# Patient Record
Sex: Female | Born: 2000 | Race: White | Hispanic: No | Marital: Single | Smoking: Never smoker
Health system: Southern US, Community
[De-identification: ages and names within clinical notes are randomized; demographics above are authoritative.]

---
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
## 2021-04-23 ENCOUNTER — Ambulatory Visit (INDEPENDENT_AMBULATORY_CARE_PROVIDER_SITE_OTHER): Payer: Medicaid Other | Admitting: Sports Medicine

## 2021-04-23 ENCOUNTER — Other Ambulatory Visit: Payer: Self-pay

## 2021-04-23 ENCOUNTER — Encounter: Payer: Self-pay | Admitting: Sports Medicine

## 2021-04-23 DIAGNOSIS — M2041 Other hammer toe(s) (acquired), right foot: Secondary | ICD-10-CM | POA: Diagnosis not present

## 2021-04-23 DIAGNOSIS — M792 Neuralgia and neuritis, unspecified: Secondary | ICD-10-CM

## 2021-04-23 DIAGNOSIS — M79675 Pain in left toe(s): Secondary | ICD-10-CM | POA: Diagnosis not present

## 2021-04-23 DIAGNOSIS — M2042 Other hammer toe(s) (acquired), left foot: Secondary | ICD-10-CM

## 2021-04-23 DIAGNOSIS — B351 Tinea unguium: Secondary | ICD-10-CM

## 2021-04-23 NOTE — Addendum Note (Signed)
Addended by: Melene Plan on: 04/23/2021 03:40 PM   Modules accepted: Orders

## 2021-04-23 NOTE — Progress Notes (Signed)
Subjective: Sherry Kim is a 20 y.o. female patient seen today in office with complaint of mildly painful thickened and discolored nails on the left 2,4,5 toes x 6 months. Patient is desiring treatment for nail changes; has tried OTC topicals/Medication in the past with no improvement and went to another podiatrist that did nothing for her nails. Reports that she has has numbness of the left 4th toe. No injury. Patient has no other pedal complaints at this time.   Patient Active Problem List   Diagnosis Date Noted   Full thickness sunburn 12/22/2020   Intrinsic eczema 12/22/2020   Posttraumatic headache 11/07/2020   Intractable migraine without aura and without status migrainosus 04/18/2019   Alternating constipation and diarrhea 04/18/2019   Irregular menstrual bleeding 04/18/2019   Tail bone pain 04/18/2019   Viral syndrome 08/22/2018   Acute viral laryngitis 04/07/2018   Meningitis in pediatric patient 10/21/2017   Dysmenorrhea in the adolescent 10/21/2017   Lactose intolerance 10/21/2017   Elevated ALT measurement 08/30/2017   Chronic idiopathic constipation 08/29/2017   Episode of dizziness 08/29/2017   BRBPR (bright red blood per rectum) 08/29/2017    No current outpatient medications on file prior to visit.   No current facility-administered medications on file prior to visit.    No Known Allergies  Objective: Physical Exam  General: Well developed, nourished, no acute distress, awake, alert and oriented x 3  Vascular: Dorsalis pedis artery 2/4 bilateral, Posterior tibial artery 2/4 bilateral, skin temperature warm to warm proximal to distal bilateral lower extremities, no varicosities, pedal hair present bilateral.  Neurological: Gross sensation present via light touch bilateral. Decreased sensation at tip of   Dermatological: Skin is warm, dry, and supple bilateral, Nails 2,4,5 on left are tender, short thick, and discolored with mild subungal debris, no  webspace macerations present bilateral, no open lesions present bilateral, no callus/corns/hyperkeratotic tissue present bilateral. No signs of infection bilateral.  Musculoskeletal: + varus 4th hammertoe L>R boney deformities noted bilateral. Muscular strength within normal limits without painon range of motion. No pain with calf compression bilateral.  Assessment and Plan:  Problem List Items Addressed This Visit   None Visit Diagnoses     Fungal infection of nail    -  Primary   Toe pain, left       Hammertoe, bilateral       Neuritis           -Examined patient -Discussed treatment options for numbness at left 4th toe -Recommend toe cap to protect toe since I feel that her hammertoe rubbing could be adding to her symptoms -Discussed treatment options for painful dystrophic nails  -Fungal culture was obtained by removing a portion of the hard nail itself from each of the involved toenails using a sterile nail nipper and sent to [facility] lab. Patient tolerated the biopsy procedure well without discomfort or need for anesthesia.  -Patient to return in 4 weeks for follow up evaluation and discussion of fungal culture results or sooner if symptoms worsen.  Asencion Islam, DPM

## 2021-05-21 ENCOUNTER — Other Ambulatory Visit: Payer: Self-pay

## 2021-05-21 ENCOUNTER — Ambulatory Visit (INDEPENDENT_AMBULATORY_CARE_PROVIDER_SITE_OTHER): Payer: Medicaid Other | Admitting: Sports Medicine

## 2021-05-21 DIAGNOSIS — M792 Neuralgia and neuritis, unspecified: Secondary | ICD-10-CM

## 2021-05-21 DIAGNOSIS — M2042 Other hammer toe(s) (acquired), left foot: Secondary | ICD-10-CM | POA: Diagnosis not present

## 2021-05-21 DIAGNOSIS — M79675 Pain in left toe(s): Secondary | ICD-10-CM

## 2021-05-21 DIAGNOSIS — M2041 Other hammer toe(s) (acquired), right foot: Secondary | ICD-10-CM

## 2021-05-21 DIAGNOSIS — B351 Tinea unguium: Secondary | ICD-10-CM | POA: Diagnosis not present

## 2021-05-21 NOTE — Progress Notes (Signed)
Subjective: Sherry Kim is a 20 y.o. female patient seen today in office for fungal culture results.  Patient admits that she still has some numbness to the left fourth toe but otherwise no other pedal complaints.  Patient also admits that her doctor is working her up for an autoimmune condition.  Patient Active Problem List   Diagnosis Date Noted   Full thickness sunburn 12/22/2020   Intrinsic eczema 12/22/2020   Posttraumatic headache 11/07/2020   Intractable migraine without aura and without status migrainosus 04/18/2019   Alternating constipation and diarrhea 04/18/2019   Irregular menstrual bleeding 04/18/2019   Tail bone pain 04/18/2019   Viral syndrome 08/22/2018   Acute viral laryngitis 04/07/2018   Meningitis in pediatric patient 10/21/2017   Dysmenorrhea in the adolescent 10/21/2017   Lactose intolerance 10/21/2017   Elevated ALT measurement 08/30/2017   Chronic idiopathic constipation 08/29/2017   Episode of dizziness 08/29/2017   BRBPR (bright red blood per rectum) 08/29/2017    No current outpatient medications on file prior to visit.   No current facility-administered medications on file prior to visit.    No Known Allergies  Objective: Physical Exam  General: Well developed, nourished, no acute distress, awake, alert and oriented x 3  Vascular: Dorsalis pedis artery 2/4 bilateral, Posterior tibial artery 2/4 bilateral, skin temperature warm to warm proximal to distal bilateral lower extremities, no varicosities, pedal hair present bilateral.   Neurological: Gross sensation present via light touch bilateral. Decreased sensation at tip of    Dermatological: Skin is warm, dry, and supple bilateral, Nails 2,4,5 on left are tender, short thick, and discolored with mild subungal debris, no webspace macerations present bilateral, no open lesions present bilateral, no callus/corns/hyperkeratotic tissue present bilateral. No signs of infection bilateral.    Musculoskeletal: + varus 4th hammertoe L>R boney deformities noted bilateral. Muscular strength within normal limits without painon range of motion. 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  Asencion Islam, DPM

## 2021-05-29 ENCOUNTER — Ambulatory Visit (INDEPENDENT_AMBULATORY_CARE_PROVIDER_SITE_OTHER): Payer: Self-pay | Admitting: *Deleted

## 2021-05-29 ENCOUNTER — Other Ambulatory Visit: Payer: Self-pay

## 2021-05-29 DIAGNOSIS — B351 Tinea unguium: Secondary | ICD-10-CM

## 2021-05-29 NOTE — Progress Notes (Signed)
Patient presents today for the 1st laser treatment. Diagnosed with mycotic nail infection by Dr. Marylene Land.   Toenail most affected 2nd, 4th, 5th toenails left.  All other systems are negative.  Nails were filed thin. Laser therapy was administered to 2, 4, 5 toenails left and patient tolerated the treatment well. All safety precautions were in place.    Follow up in 4 weeks for laser # 2.  Picture of nails taken today to document visual progress

## 2021-07-03 ENCOUNTER — Other Ambulatory Visit: Payer: Self-pay

## 2021-07-03 ENCOUNTER — Ambulatory Visit (INDEPENDENT_AMBULATORY_CARE_PROVIDER_SITE_OTHER): Payer: Medicaid Other | Admitting: *Deleted

## 2021-07-03 DIAGNOSIS — B351 Tinea unguium: Secondary | ICD-10-CM

## 2021-07-03 NOTE — Progress Notes (Signed)
Patient presents today for the 2nd laser treatment. Diagnosed with mycotic nail infection by Dr. Marylene Land.   Toenail most affected 2nd, 4th, 5th toenails left. Looking some better.  All other systems are negative.  Nails were filed thin. Laser therapy was administered to 2, 4, 5 toenails left and patient tolerated the treatment well. All safety precautions were in place.    Follow up in 4 weeks for laser # 3.

## 2021-07-20 NOTE — Progress Notes (Signed)
Office Visit Note  Patient: Sherry Kim             Date of Birth: 02-Feb-2001           MRN: [id number]             PCP: Idelia Salm, MD Referring: Wilhelmina Mcardle, MD Visit Date: 07/21/2021 Occupation: Pre-med student  Subjective:  New Patient (Initial Visit) (Hit head 10/2020, migraines, total body pain, fatigue, abnormal labs, Positive ANA, labs in [place])   History of Present Illness: Sherry Kim is a 21 y.o. female here for positive ANA.  She is having multiple new symptoms since early last year particularly after new headaches from striking her head on a roller coaster in April.  Is unclear whether she experienced a true concussion or just had some exacerbation of underlying headaches probably migrainous.  Before this she was generally well medical history including eczema, migraine, and possible irritable bowels.  Headaches have been the biggest problem but also experiencing worsening of fatigue and increased overall joint pains.  Intermittently symptoms including facial rashes that come and go for days at a time, pain and mild swelling under the armpits currently on the right side, spontaneous bruising on her thighs, and purple discoloration of toes. She has been hesitant to start prescription migraine medications still concerned about underlying causes with a normal brain MRI in November.  She has associated photophobia.  Lab work-up for this in neurology clinic demonstrated the highly positive ANA antibodies.  She most often uses ibuprofen and sleeps with resolution of headache. The facial rashes come and go no association with sun exposure.  She does not experience brief flushing here or in other locations with heat or humidity.  She had eczema rashes more in the extremities but had improved before now.  She denies any noticeable alopecia.  No oral or nasal ulcers except when biting her cheek.  Painful swelling under her armpits without any  palpable nodules.  Purple discoloration in her toes when cold this improves after rewarming.  She does not see any pallor and there is no involvement of her fingers.  Spontaneous bruises have been mostly in the upper thigh without significant pain or itching sensation.  She has no history of abnormal bleeding or blood clots and has never been pregnant.  Labs reviewed 05/2021 ANA 1:2560 homogenous 1:2560 speckled dsDNA 34 SM, RNP, Ro, La neg RF neg ESR 11 TSH 6.27 Free T4 1.02 UA wnl  Activities of Daily Living:  Patient reports morning stiffness for 0  none .   Patient Reports nocturnal pain.  Difficulty dressing/grooming: Denies Difficulty climbing stairs: Denies Difficulty getting out of chair: Denies Difficulty using hands for taps, buttons, cutlery, and/or writing: Reports  Review of Systems  Constitutional:  Positive for fatigue.  HENT:  Positive for mouth dryness.   Eyes:  Positive for dryness.  Respiratory:  Negative for shortness of breath.   Cardiovascular:  Positive for swelling in legs/feet.  Gastrointestinal:  Positive for constipation.  Endocrine: Positive for cold intolerance, heat intolerance and excessive thirst.  Genitourinary:  Negative for difficulty urinating.  Musculoskeletal:  Positive for joint pain, joint pain, muscle weakness and muscle tenderness.  Skin:  Positive for rash.  Allergic/Immunologic: Negative for susceptible to infections.  Neurological:  Positive for dizziness, light-headedness, numbness, headaches, memory loss and weakness.  Hematological:  Positive for bruising/bleeding tendency.  Psychiatric/Behavioral:  Positive for sleep disturbance.    PMFS History:  Patient Active  Problem List   Diagnosis Date Noted   Positive ANA (antinuclear antibody) 07/21/2021   TSH elevation 07/21/2021   Facial rash 07/21/2021   Intrinsic eczema 12/22/2020   Headache 11/07/2020   Intractable migraine without aura and without status migrainosus 04/18/2019    Alternating constipation and diarrhea 04/18/2019   Irregular menstrual bleeding 04/18/2019   Tail bone pain 04/18/2019   Acute viral laryngitis 04/07/2018   Dysmenorrhea in the adolescent 10/21/2017   Lactose intolerance 10/21/2017   Elevated liver enzymes 08/30/2017   Chronic idiopathic constipation 08/29/2017   Episode of dizziness 08/29/2017   BRBPR (bright red blood per rectum) 08/29/2017    History reviewed. No pertinent past medical history.  Family History  Problem Relation Age of Onset   Migraines Mother    Ankylosing spondylitis Father    History reviewed. No pertinent surgical history. Social History   Social History Narrative   Not on file   Immunization History  Administered Date(s) Administered   HPV 9-valent 04/03/2014, 06/03/2014   Meningococcal Conjugate 04/03/2014     Objective: Vital Signs: BP 104/66 (BP Location: Right Arm, Patient Position: Sitting, Cuff Size: Normal)    Pulse 76    Resp 14    Ht _0  (1.575 m)    Wt 167 lb (75.8 kg)    BMI 30.54 kg/m    Physical Exam HENT:     Right Ear: External ear normal.     Left Ear: External ear normal.     Mouth/Throat:     Mouth: Mucous membranes are moist.     Pharynx: Oropharynx is clear.  Eyes:     Conjunctiva/sclera: Conjunctivae normal.  Cardiovascular:     Rate and Rhythm: Normal rate and regular rhythm.  Pulmonary:     Effort: Pulmonary effort is normal.     Breath sounds: Normal breath sounds.  Musculoskeletal:     Right lower leg: No edema.     Left lower leg: No edema.  Skin:    General: Skin is warm and dry.     Findings: No rash.     Comments: Normal nailfold capillaroscopy Violaceous left great toenail, dystrophy or onychomycosis changes at left toenails  Neurological:     General: No focal deficit present.     Mental Status: She is alert.     Deep Tendon Reflexes: Reflexes normal.  Psychiatric:        Mood and Affect: Mood normal.     Musculoskeletal Exam:  Neck full ROM no  tenderness Shoulders full ROM no tenderness or swelling Elbows hyperextensible with no tenderness or swelling Wrists full ROM no tenderness or swelling Fingers hyperextensible at multiple joints, thumb apposition to forearm without pain, mild tenderness present in PIPs no swelling Knees full ROM no tenderness or swelling Ankles full ROM no tenderness or swelling   Investigation: No additional findings.  Imaging: No results found.  Recent Labs: Lab Results  Component Value Date   WBC 6.8 07/21/2021   HGB 13.8 07/21/2021   PLT 292 07/21/2021    Speciality Comments: No specialty comments available.  Procedures:  No procedures performed Allergies: Patient has no known allergies.   Assessment / Plan:     Visit Diagnoses: Positive ANA (antinuclear antibody) - Plan: Anti-DNA antibody, double-stranded, C3 and C4, Beta-2 glycoprotein antibodies, Cardiolipin antibodies, IgG, IgM, IgA, Lupus Anticoagulant Eval w/Reflex, Thyroid Peroxidase Antibodies (TPO) (REFL), CBC with Differential/Platelet  Highly positive antibody titer checking specific antibody tests as above, complements, also antiphospholipid antibodies for possible  extra criteria disease. No proteinuria on fairly recent UA. 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  Intractable migraine without aura and without status migrainosus Nonintractable headache, unspecified chronicity pattern, unspecified headache type  Ongoing headaches the worst symptom currently so far normal brain imaging and treatment with migraine headache plan partially beneficial so far. Working up for positive ANA as above.  TSH elevation  Mildly increased TSH with normal thyroid hormone levels. Does not have obvious symptoms to indicate subclinical hypothyroidism. Will also check TPO Abs as part of ANA workup.  Facial rash  Not much current erythema  unclear whether could be experiencing malar rash. Lack of photosensitivity and increased symptoms in dry, winter weather not typical for systemic inflammatory disease.  Orders: Orders Placed This Encounter  Procedures   Anti-DNA antibody, double-stranded   C3 and C4   Beta-2 glycoprotein antibodies   Cardiolipin antibodies, IgG, IgM, IgA   Lupus Anticoagulant Eval w/Reflex   Thyroid Peroxidase Antibodies (TPO) (REFL)   CBC with Differential/Platelet   rflx hexagonal Phase Confirm   No orders of the defined types were placed in this encounter.   Follow-Up Instructions: Return for New pt +ANA/[facility] f/u.   Collier Salina, MD  Note - This record has been created using [employer].  Chart creation errors have been sought, but may not always  have been located. Such creation errors do not reflect on  the standard of medical care.

## 2021-07-21 ENCOUNTER — Encounter: Payer: Self-pay | Admitting: Internal Medicine

## 2021-07-21 ENCOUNTER — Ambulatory Visit (INDEPENDENT_AMBULATORY_CARE_PROVIDER_SITE_OTHER): Payer: Medicaid Other | Admitting: Internal Medicine

## 2021-07-21 ENCOUNTER — Other Ambulatory Visit: Payer: Self-pay

## 2021-07-21 VITALS — BP 104/66 | HR 76 | Resp 14 | Ht 62.0 in | Wt 167.0 lb

## 2021-07-21 DIAGNOSIS — R7989 Other specified abnormal findings of blood chemistry: Secondary | ICD-10-CM | POA: Insufficient documentation

## 2021-07-21 DIAGNOSIS — G43019 Migraine without aura, intractable, without status migrainosus: Secondary | ICD-10-CM | POA: Diagnosis not present

## 2021-07-21 DIAGNOSIS — R519 Headache, unspecified: Secondary | ICD-10-CM | POA: Diagnosis not present

## 2021-07-21 DIAGNOSIS — R768 Other specified abnormal immunological findings in serum: Secondary | ICD-10-CM

## 2021-07-21 DIAGNOSIS — R21 Rash and other nonspecific skin eruption: Secondary | ICD-10-CM | POA: Insufficient documentation

## 2021-07-25 LAB — CBC WITH DIFFERENTIAL/PLATELET
Absolute Monocytes: 517 cells/uL (ref 200–950)
Basophils Absolute: 27 cells/uL (ref 0–200)
Basophils Relative: 0.4 %
Eosinophils Absolute: 143 cells/uL (ref 15–500)
Eosinophils Relative: 2.1 %
HCT: 43 % (ref 35.0–45.0)
Hemoglobin: 13.8 g/dL (ref 11.7–15.5)
Lymphs Abs: 2611 cells/uL (ref 850–3900)
MCH: 27.9 pg (ref 27.0–33.0)
MCHC: 32.1 g/dL (ref 32.0–36.0)
MCV: 86.9 fL (ref 80.0–100.0)
MPV: 10.7 fL (ref 7.5–12.5)
Monocytes Relative: 7.6 %
Neutro Abs: 3502 cells/uL (ref 1500–7800)
Neutrophils Relative %: 51.5 %
Platelets: 292 10*3/uL (ref 140–400)
RBC: 4.95 10*6/uL (ref 3.80–5.10)
RDW: 13.1 % (ref 11.0–15.0)
Total Lymphocyte: 38.4 %
WBC: 6.8 10*3/uL (ref 3.8–10.8)

## 2021-07-25 LAB — RFLX HEXAGONAL PHASE CONFIRM: Hexagonal Phase Conf: NEGATIVE

## 2021-07-25 LAB — LUPUS ANTICOAGULANT EVAL W/ REFLEX
PTT-LA Screen: 44 s — ABNORMAL HIGH (ref <=40)
dRVVT: 37 s (ref <=45)

## 2021-07-25 LAB — CARDIOLIPIN ANTIBODIES, IGG, IGM, IGA
Anticardiolipin IgA: <2 APL-U/mL
Anticardiolipin IgG: <2 GPL-U/mL
Anticardiolipin IgM: <2 MPL-U/mL

## 2021-07-25 LAB — ANTI-DNA ANTIBODY, DOUBLE-STRANDED: ds DNA Ab: 2 IU/mL

## 2021-07-25 LAB — BETA-2 GLYCOPROTEIN ANTIBODIES
Beta-2 Glyco 1 IgA: <2 U/mL
Beta-2 Glyco 1 IgM: <2 U/mL
Beta-2 Glyco I IgG: <2 U/mL

## 2021-07-25 LAB — THYROID PEROXIDASE ANTIBODIES (TPO) (REFL): Thyroperoxidase Ab SerPl-aCnc: 3 IU/mL (ref <9)

## 2021-07-25 LAB — C3 AND C4
C3 Complement: 139 mg/dL (ref 83–193)
C4 Complement: 35 mg/dL (ref 15–57)

## 2021-07-31 ENCOUNTER — Ambulatory Visit (INDEPENDENT_AMBULATORY_CARE_PROVIDER_SITE_OTHER): Payer: Medicaid Other

## 2021-07-31 ENCOUNTER — Other Ambulatory Visit: Payer: Self-pay

## 2021-07-31 DIAGNOSIS — B351 Tinea unguium: Secondary | ICD-10-CM

## 2021-07-31 NOTE — Progress Notes (Signed)
Patient presents today for the 3rd laser treatment. Diagnosed with mycotic nail infection by Dr. Marylene Land.   Toenail most affected 2nd, 4th, 5th toenails left. Looking some better.  All other systems are negative.  Nails were filed thin. Laser therapy was administered to 2, 4, 5 toenails left and patient tolerated the treatment well. All safety precautions were in place.    Follow up in 4 weeks for laser # 4.

## 2021-08-09 NOTE — Progress Notes (Signed)
Office Visit Note  Patient: Sherry Kim             Date of Birth: 17-Jan-2001           MRN: [id number]             PCP: Idelia Salm, MD Referring: Deland Pretty* Visit Date: 08/10/2021   Subjective:   History of Present Illness: Sherry Kim is a 21 y.o. female here for follow up for positive ANA with migraines, fatigue, joint pains, and discolorations of fingers and toes. ANA was very highly positive other specific antibody markers have been negative. Lupus anticoagulant test was negative on confirmatory test. Since our last visit symptoms are about the same she still has headaches ongoing frequently, difficulty getting up and moving in the mornings, rash that feels hot or burning onto her face.  Previous HPI 07/21/21 Sherry Kim is a 21 y.o. female here for positive ANA.  She is having multiple new symptoms since early last year particularly after new headaches from striking her head on a roller coaster in April.  Is unclear whether she experienced a true concussion or just had some exacerbation of underlying headaches probably migrainous.  Before this she was generally well medical history including eczema, migraine, and possible irritable bowels.  Headaches have been the biggest problem but also experiencing worsening of fatigue and increased overall joint pains.  Intermittently symptoms including facial rashes that come and go for days at a time, pain and mild swelling under the armpits currently on the right side, spontaneous bruising on her thighs, and purple discoloration of toes. She has been hesitant to start prescription migraine medications still concerned about underlying causes with a normal brain MRI in November.  She has associated photophobia.  Lab work-up for this in neurology clinic demonstrated the highly positive ANA antibodies.  She most often uses ibuprofen and sleeps with resolution of headache. The facial rashes come and go  no association with sun exposure.  She does not experience brief flushing here or in other locations with heat or humidity.  She had eczema rashes more in the extremities but had improved before now.  She denies any noticeable alopecia.  No oral or nasal ulcers except when biting her cheek.  Painful swelling under her armpits without any palpable nodules.  Purple discoloration in her toes when cold this improves after rewarming.  She does not see any pallor and there is no involvement of her fingers.  Spontaneous bruises have been mostly in the upper thigh without significant pain or itching sensation.  She has no history of abnormal bleeding or blood clots and has never been pregnant.   Labs reviewed 05/2021 ANA 1:2560 homogenous 1:2560 speckled dsDNA 34 SM, RNP, Ro, La neg RF neg ESR 11 TSH 6.27 Free T4 1.02 UA wnl   Review of Systems  Constitutional:  Positive for fatigue.  HENT:  Positive for mouth dryness. Negative for mouth sores and nose dryness.   Eyes:  Positive for dryness. Negative for pain and itching.  Respiratory:  Negative for shortness of breath and difficulty breathing.   Cardiovascular:  Positive for chest pain. Negative for palpitations.  Gastrointestinal:  Positive for constipation and diarrhea. Negative for blood in stool.  Endocrine: Negative for increased urination.  Genitourinary:  Negative for difficulty urinating.  Musculoskeletal:  Positive for joint pain, joint pain, myalgias, muscle tenderness and myalgias. Negative for joint swelling and morning stiffness.  Skin:  Positive for color change  and rash. Negative for redness.  Allergic/Immunologic: Negative for susceptible to infections.  Neurological:  Positive for numbness, headaches and memory loss. Negative for dizziness and weakness.  Hematological:  Positive for bruising/bleeding tendency.  Psychiatric/Behavioral:  Positive for confusion.    PMFS History:  Patient Active Problem List   Diagnosis Date  Noted   Axillary lymphadenopathy 08/10/2021   Positive ANA (antinuclear antibody) 07/21/2021   TSH elevation 07/21/2021   Facial rash 07/21/2021   Intrinsic eczema 12/22/2020   Headache 11/07/2020   Intractable migraine without aura and without status migrainosus 04/18/2019   Alternating constipation and diarrhea 04/18/2019   Irregular menstrual bleeding 04/18/2019   Tail bone pain 04/18/2019   Acute viral laryngitis 04/07/2018   Dysmenorrhea in the adolescent 10/21/2017   Lactose intolerance 10/21/2017   Elevated liver enzymes 08/30/2017   Chronic idiopathic constipation 08/29/2017   Episode of dizziness 08/29/2017   BRBPR (bright red blood per rectum) 08/29/2017    History reviewed. No pertinent past medical history.  Family History  Problem Relation Age of Onset   Migraines Mother    Ankylosing spondylitis Father    History reviewed. No pertinent surgical history. Social History   Social History Narrative   Not on file   Immunization History  Administered Date(s) Administered   HPV 9-valent 04/03/2014, 06/03/2014   Meningococcal Conjugate 04/03/2014     Objective: Vital Signs: BP 114/75 (BP Location: Left Arm, Patient Position: Sitting, Cuff Size: Normal)    Pulse 81    Ht _0  (1.575 m)    Wt 167 lb (75.8 kg)    BMI 30.54 kg/m    Physical Exam Cardiovascular:     Rate and Rhythm: Normal rate and regular rhythm.  Pulmonary:     Effort: Pulmonary effort is normal.     Breath sounds: Normal breath sounds.  Skin:    General: Skin is warm and dry.     Findings: No rash.  Neurological:     Mental Status: She is alert.  Psychiatric:        Mood and Affect: Mood normal.    Musculoskeletal: Shoulders full ROM b/l Tenderness to bilateral armpits, probable swelling, no superficial nodules or cysts Elbows full ROM no tenderness or swelling Wrists full ROM no tenderness or swelling Fingers full ROM no tenderness or swelling   Investigation: No additional  findings.  Imaging: No results found.  Recent Labs: Lab Results  Component Value Date   WBC 6.8 07/21/2021   HGB 13.8 07/21/2021   PLT 292 07/21/2021    Speciality Comments: No specialty comments available.  Procedures:  No procedures performed Allergies: Patient has no known allergies.   Assessment / Plan:     Visit Diagnoses: Positive ANA (antinuclear antibody)  Very high ANA titers although more specific antibody markers all negative. She has enough clinical criteria for possible UCTD but discussed this would not be very definite diagnosis. She prefers to observe for now without starting any DMARD which is very reasonable. We should f/u in 6 months unless new problems needing addressed sooner.  Axillary lymphadenopathy  Axillary tenderness and swelling, with what sounds like nodules or lymphadenopathy although I cannot appreciate focal nodules on exam just a lot of tenderness. I recommend axillary ultrasound would be most sensitive test to rule out a serious problem, right is worse by my exam although reports both involved.   Orders: Orders Placed This Encounter  Procedures   [place] AXILLA RIGHT   No orders of the defined types  were placed in this encounter.    Follow-Up Instructions: Return in about 6 months (around 02/07/2022) for ?UCTD +ANA f/u 42mo.   CCollier Salina MD  Note - This record has been created using [employer]  Chart creation errors have been sought, but may not always  have been located. Such creation errors do not reflect on  the standard of medical care.

## 2021-08-10 ENCOUNTER — Other Ambulatory Visit: Payer: Self-pay

## 2021-08-10 ENCOUNTER — Ambulatory Visit (INDEPENDENT_AMBULATORY_CARE_PROVIDER_SITE_OTHER): Payer: Medicaid Other | Admitting: Internal Medicine

## 2021-08-10 ENCOUNTER — Encounter: Payer: Self-pay | Admitting: Internal Medicine

## 2021-08-10 VITALS — BP 114/75 | HR 81 | Ht 62.0 in | Wt 167.0 lb

## 2021-08-10 DIAGNOSIS — R590 Localized enlarged lymph nodes: Secondary | ICD-10-CM

## 2021-08-10 DIAGNOSIS — R768 Other specified abnormal immunological findings in serum: Secondary | ICD-10-CM

## 2021-08-21 ENCOUNTER — Ambulatory Visit
Admission: RE | Admit: 2021-08-21 | Discharge: 2021-08-21 | Disposition: A | Discharge status: Home / Self Care | Payer: Medicaid Other | Source: Ambulatory Visit | Attending: Internal Medicine | Admitting: Internal Medicine

## 2021-08-21 DIAGNOSIS — R590 Localized enlarged lymph nodes: Secondary | ICD-10-CM

## 2021-08-21 DIAGNOSIS — R768 Other specified abnormal immunological findings in serum: Secondary | ICD-10-CM

## 2021-08-21 NOTE — Progress Notes (Signed)
Your ultrasound looks good. Unfortunately I am still not sure about the cause for pain in that area. The good news is there are no abnormal nodules or lymph nodes that could suggest a serious problem.

## 2021-08-28 ENCOUNTER — Other Ambulatory Visit: Payer: Self-pay

## 2021-08-28 ENCOUNTER — Ambulatory Visit (INDEPENDENT_AMBULATORY_CARE_PROVIDER_SITE_OTHER): Payer: Medicaid Other

## 2021-08-28 DIAGNOSIS — B351 Tinea unguium: Secondary | ICD-10-CM

## 2021-08-28 NOTE — Progress Notes (Signed)
Patient presents today for the 3rd laser treatment. Diagnosed with mycotic nail infection by Dr. Cannon Kettle.   Toenail most affected 2nd, 4th, toenails left. Looking some better.  All other systems are negative.  Nails were filed thin. Laser therapy was administered to 2, 4 toenails left and patient tolerated the treatment well. All safety precautions were in place.    Follow up in 6 weeks for laser # 5.

## 2021-10-09 ENCOUNTER — Other Ambulatory Visit: Payer: Medicaid Other

## 2021-10-15 ENCOUNTER — Ambulatory Visit (INDEPENDENT_AMBULATORY_CARE_PROVIDER_SITE_OTHER): Payer: Medicaid Other | Admitting: Sports Medicine

## 2021-10-15 DIAGNOSIS — M79675 Pain in left toe(s): Secondary | ICD-10-CM | POA: Diagnosis not present

## 2021-10-15 DIAGNOSIS — M792 Neuralgia and neuritis, unspecified: Secondary | ICD-10-CM | POA: Diagnosis not present

## 2021-10-15 DIAGNOSIS — B351 Tinea unguium: Secondary | ICD-10-CM

## 2021-10-15 DIAGNOSIS — M2042 Other hammer toe(s) (acquired), left foot: Secondary | ICD-10-CM

## 2021-10-15 DIAGNOSIS — M2041 Other hammer toe(s) (acquired), right foot: Secondary | ICD-10-CM

## 2021-10-15 NOTE — Progress Notes (Signed)
Subjective: ?Sherry Kim is a 21 y.o. female patient seen today in office for evaluation of left second and fourth toenails states that she has went for 4 sessions of laser and she is not sure that it is really helping any and wants to talk about further options states that she still gets some numbness at the lateral side of the left fourth toe even though she has been consistent with wearing the toe cap.  Patient denies any other pedal complaints at this time. ? ?Patient Active Problem List  ? Diagnosis Date Noted  ? Axillary lymphadenopathy 08/10/2021  ? Positive ANA (antinuclear antibody) 07/21/2021  ? TSH elevation 07/21/2021  ? Facial rash 07/21/2021  ? Intrinsic eczema 12/22/2020  ? Headache 11/07/2020  ? Intractable migraine without aura and without status migrainosus 04/18/2019  ? Alternating constipation and diarrhea 04/18/2019  ? Irregular menstrual bleeding 04/18/2019  ? Tail bone pain 04/18/2019  ? Acute viral laryngitis 04/07/2018  ? Dysmenorrhea in the adolescent 10/21/2017  ? Lactose intolerance 10/21/2017  ? Elevated liver enzymes 08/30/2017  ? Chronic idiopathic constipation 08/29/2017  ? Episode of dizziness 08/29/2017  ? BRBPR (bright red blood per rectum) 08/29/2017  ? ? ?Current Outpatient Medications on File Prior to Visit  ?Medication Sig Dispense Refill  ? Acetaminophen 500 MG capsule Take by mouth as needed.    ? ?No current facility-administered medications on file prior to visit.  ? ? ?No Known Allergies ? ?Objective: ?Physical Exam ? ?General: Well developed, nourished, no acute distress, awake, alert and oriented x 3 ? ?Vascular: Dorsalis pedis artery 2/4 bilateral, Posterior tibial artery 2/4 bilateral, skin temperature warm to warm proximal to distal bilateral lower extremities, no varicosities, pedal hair present bilateral. ?  ?Neurological: Gross sensation present via light touch bilateral. Decreased sensation at tip of  ?  ?Dermatological: Skin is warm, dry, and supple  bilateral, Nails 2,4,5 on left are tender, short thick, and discolored with mild subungal debris with left second and fourth toenails most involved, no webspace macerations present bilateral, no open lesions present bilateral, no callus/corns/hyperkeratotic tissue present bilateral. No signs of infection bilateral. ?  ?Musculoskeletal: + varus 4th hammertoe L>R boney deformities noted bilateral. Muscular strength within normal limits without painon range of motion. No pain with calf compression bilateral ? ? ?Assessment and Plan:  ?Problem List Items Addressed This Visit   ?None ?Visit Diagnoses   ? ? Fungal infection of nail    -  Primary  ? Toe pain, left      ? Neuritis      ? Hammertoe, bilateral      ? ?  ? ? ?-Examined patient ?-Discussed treatment options for painful mycotic nails ?-At this time patient wants to have temporary nail avulsion advised patient that even with the avulsion there is still about a 50% chance that the nail may grow out with fungus again patient understands the thoughts and wants to wait till she returns from [facility] to have this done ?-Advised good hygiene habits and comfortable shoes that do not rub toes meanwhile and advised patient that for any nerve pain at the tip of the left fourth toe after removing the nail the pain is still better than likely it is the result of underlying nerve issues that could be further work-up at a later time by her PCP or neurologist. ?Asencion Islam, DPM ?  ?

## 2021-12-03 ENCOUNTER — Ambulatory Visit: Payer: Medicaid Other | Admitting: Sports Medicine

## 2022-02-08 ENCOUNTER — Ambulatory Visit: Payer: 59 | Admitting: Internal Medicine

## 2022-02-15 NOTE — Progress Notes (Signed)
Office Visit Note  Patient: Sherry Kim             Date of Birth: 04/19/2001           MRN: [id number]             PCP: Yvone Neu, MD Referring: Sherry Kim* Visit Date: 02/23/2022   Subjective:  Follow-up (Feeling pretty good.)   History of Present Illness: Sherry Kim is a 21 y.o. female here for follow up for positive ANA with migraines, fatigue, joint pains, and discolorations of fingers and toes.  Overall she has been doing well.  She continues to have mild facial rashes much of the time at baseline.  Still has pallor affecting the toes during cold exposure and she feels like her feet stay cold much of the time.  Still has some joint pains usually increased with heavier use does not see any associated swelling or discoloration.   Previous HPI 08/10/2021 Sherry Kim is a 21 y.o. female here for follow up for positive ANA with migraines, fatigue, joint pains, and discolorations of fingers and toes. ANA was very highly positive other specific antibody markers have been negative. Lupus anticoagulant test was negative on confirmatory test. Since our last visit symptoms are about the same she still has headaches ongoing frequently, difficulty getting up and moving in the mornings, rash that feels hot or burning onto her face.   Previous HPI 07/21/21 Sherry Kim is a 21 y.o. female here for positive ANA.  She is having multiple new symptoms since early last year particularly after new headaches from striking her head on a roller coaster in April.  Is unclear whether she experienced a true concussion or just had some exacerbation of underlying headaches probably migrainous.  Before this she was generally well medical history including eczema, migraine, and possible irritable bowels.  Headaches have been the biggest problem but also experiencing worsening of fatigue and increased overall joint pains.  Intermittently symptoms including  facial rashes that come and go for days at a time, pain and mild swelling under the armpits currently on the right side, spontaneous bruising on her thighs, and purple discoloration of toes. She has been hesitant to start prescription migraine medications still concerned about underlying causes with a normal brain MRI in November.  She has associated photophobia.  Lab work-up for this in neurology clinic demonstrated the highly positive ANA antibodies.  She most often uses ibuprofen and sleeps with resolution of headache. The facial rashes come and go no association with sun exposure.  She does not experience brief flushing here or in other locations with heat or humidity.  She had eczema rashes more in the extremities but had improved before now.  She denies any noticeable alopecia.  No oral or nasal ulcers except when biting her cheek.  Painful swelling under her armpits without any palpable nodules.  Purple discoloration in her toes when cold this improves after rewarming.  She does not see any pallor and there is no involvement of her fingers.  Spontaneous bruises have been mostly in the upper thigh without significant pain or itching sensation.  She has no history of abnormal bleeding or blood clots and has never been pregnant.   Labs reviewed 05/2021 ANA 1:2560 homogenous 1:2560 speckled dsDNA 34 SM, RNP, Ro, La neg RF neg ESR 11 TSH 6.27 Free T4 1.02 UA wnl   Review of Systems  Constitutional:  Negative for fatigue.  HENT:  Positive for mouth dryness. Negative for mouth sores.   Eyes:  Negative for dryness.  Respiratory:  Negative for shortness of breath.   Cardiovascular:  Negative for chest pain and palpitations.  Gastrointestinal:  Negative for blood in stool, constipation and diarrhea.  Endocrine: Negative for increased urination.  Genitourinary:  Negative for involuntary urination.  Musculoskeletal:  Negative for joint pain, joint pain, joint swelling, myalgias, muscle weakness,  morning stiffness, muscle tenderness and myalgias.  Skin:  Negative for color change, rash, hair loss and sensitivity to sunlight.  Allergic/Immunologic: Negative for susceptible to infections.  Neurological:  Positive for dizziness. Negative for headaches.  Hematological:  Negative for swollen glands.  Psychiatric/Behavioral:  Positive for sleep disturbance. Negative for depressed mood. The patient is not nervous/anxious.     PMFS History:  Patient Active Problem List   Diagnosis Date Noted   Axillary lymphadenopathy 08/10/2021   Positive ANA (antinuclear antibody) 07/21/2021   TSH elevation 07/21/2021   Facial rash 07/21/2021   Intrinsic eczema 12/22/2020   Headache 11/07/2020   Intractable migraine without aura and without status migrainosus 04/18/2019   Alternating constipation and diarrhea 04/18/2019   Irregular menstrual bleeding 04/18/2019   Tail bone pain 04/18/2019   Acute viral laryngitis 04/07/2018   Dysmenorrhea in the adolescent 10/21/2017   Lactose intolerance 10/21/2017   Elevated liver enzymes 08/30/2017   Chronic idiopathic constipation 08/29/2017   Episode of dizziness 08/29/2017   BRBPR (bright red blood per rectum) 08/29/2017    History reviewed. No pertinent past medical history.  Family History  Problem Relation Age of Onset   Migraines Mother    Ankylosing spondylitis Father    History reviewed. No pertinent surgical history. Social History   Social History Narrative   Not on file   Immunization History  Administered Date(s) Administered   HPV 9-valent 04/03/2014, 06/03/2014   Meningococcal Conjugate 04/03/2014     Objective: Vital Signs: BP 121/80 (BP Location: Left Arm, Patient Position: Sitting, Cuff Size: Normal)   Pulse 80   Resp 13   Ht $R'5\' 2"'rH$  (1.575 m)   Wt 169 lb (76.7 kg)   BMI 30.91 kg/m    Physical Exam HENT:     Mouth/Throat:     Mouth: Mucous membranes are moist.     Pharynx: Oropharynx is clear.  Eyes:      Conjunctiva/sclera: Conjunctivae normal.  Cardiovascular:     Rate and Rhythm: Normal rate and regular rhythm.  Pulmonary:     Effort: Pulmonary effort is normal.     Breath sounds: Normal breath sounds.  Musculoskeletal:     Right lower leg: No edema.     Left lower leg: No edema.  Skin:    General: Skin is warm and dry.  Neurological:     Mental Status: She is alert.  Psychiatric:        Mood and Affect: Mood normal.      Musculoskeletal Exam:  Shoulders full ROM no tenderness or swelling Elbows full ROM no tenderness or swelling Fingers and wrists demonstrate hyperextensibility on both sides Knees full ROM no tenderness or swelling Ankles full ROM no tenderness or swelling   Investigation: No additional findings.  Imaging: No results found.  Recent Labs: Lab Results  Component Value Date   WBC 6.8 07/21/2021   HGB 13.8 07/21/2021   PLT 292 07/21/2021    Speciality Comments: No specialty comments available.  Procedures:  No procedures performed Allergies: Patient has no known allergies.   Assessment /  Plan:     Visit Diagnoses: Positive ANA (antinuclear antibody) - Very high ANA titers although more specific antibody markers all negative.   Recommending continued monitoring due to very high ANA titer and multiple nonspecific symptoms including arthralgias and Raynaud's in a young female.  Rechecking double-stranded DNA antibody titer.  However do not see definite evidence for connective tissue disease again at this time and can change to follow-up in a year doing well.  Axillary lymphadenopathy  Reported previous clinic and according to patient is episodic, no swelling present at this time.  TSH elevation - Plan: T3, T4, free, TSH, Anti-DNA antibody, double-stranded  Previous TSH above 6 with normal thyroid hormone markers we will recheck panel today.  Facial rash  Mild erythema without associated symptoms and face neck and upper chest distribution may  have mild rosacea.  No photosensitivity or concerning lesions.  Orders: Orders Placed This Encounter  Procedures   T3   T4, free   TSH   Anti-DNA antibody, double-stranded   No orders of the defined types were placed in this encounter.    Follow-Up Instructions: Return in about 1 year (around 02/24/2023) for +ANA/raynauds/high TSH f/u 6yr.   Collier Salina, MD  Note - This record has been created using [employer].  Chart creation errors have been sought, but may not always  have been located. Such creation errors do not reflect on  the standard of medical care.

## 2022-02-23 ENCOUNTER — Ambulatory Visit: Payer: Medicaid Other | Attending: Internal Medicine | Admitting: Internal Medicine

## 2022-02-23 ENCOUNTER — Encounter: Payer: Self-pay | Admitting: Internal Medicine

## 2022-02-23 VITALS — BP 121/80 | HR 80 | Resp 13 | Ht 62.0 in | Wt 169.0 lb

## 2022-02-23 DIAGNOSIS — R768 Other specified abnormal immunological findings in serum: Secondary | ICD-10-CM

## 2022-02-23 DIAGNOSIS — R21 Rash and other nonspecific skin eruption: Secondary | ICD-10-CM

## 2022-02-23 DIAGNOSIS — R590 Localized enlarged lymph nodes: Secondary | ICD-10-CM | POA: Diagnosis not present

## 2022-02-23 DIAGNOSIS — R7989 Other specified abnormal findings of blood chemistry: Secondary | ICD-10-CM

## 2022-02-24 LAB — T4, FREE: Free T4: 1.3 ng/dL (ref 0.8–1.4)

## 2022-02-24 LAB — T3: T3, Total: 134 ng/dL (ref 86–192)

## 2022-02-24 LAB — TSH: TSH: 3.05 mIU/L

## 2022-02-24 LAB — ANTI-DNA ANTIBODY, DOUBLE-STRANDED: ds DNA Ab: 1 IU/mL

## 2022-02-26 NOTE — Progress Notes (Signed)
Lab results look good her thyroid function tests are all within normal limits at this time including the previously high TSH.  Her double-stranded DNA antibody test is negative. I do not think any specific additional testing or intervention is needed at this time but she can follow up as needed.

## 2022-05-02 IMAGING — CT CT HEAD W/O CM
3 series · 16 of 47 positions shown, 19 images · non-contrast
Comparison: None.

CLINICAL DATA: Blunt trauma to the head several days ago with
persistent headaches, initial encounter

EXAM:
CT HEAD WITHOUT CONTRAST
TECHNIQUE: Contiguous axial images were obtained from the base of the skull
through the vertex without intravenous contrast.

[Series 3: head wo · axial · 0.42mm/px · z∈[-158,-33]mm · 10 of 30 slices shown, 13 images]
[im 3/30  brain]
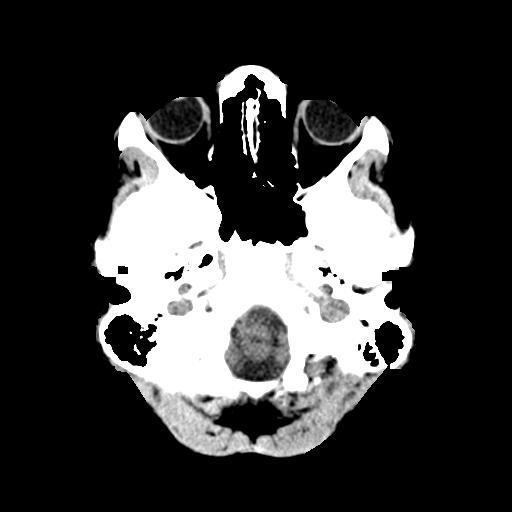
[im 3/30  bone]
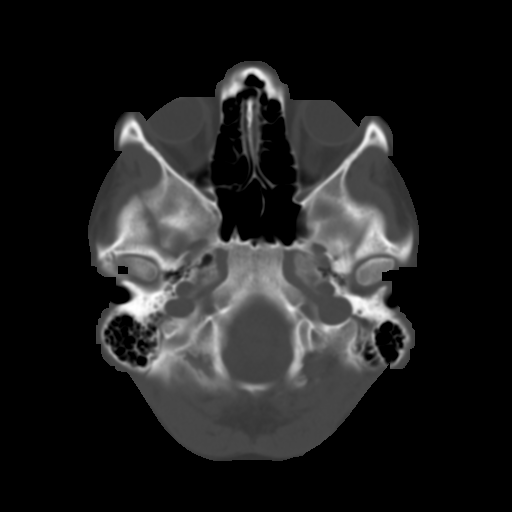
[im 6/30  brain]
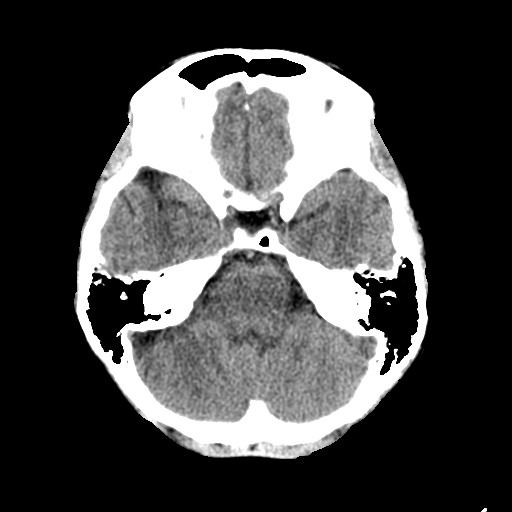
[im 9/30  brain]
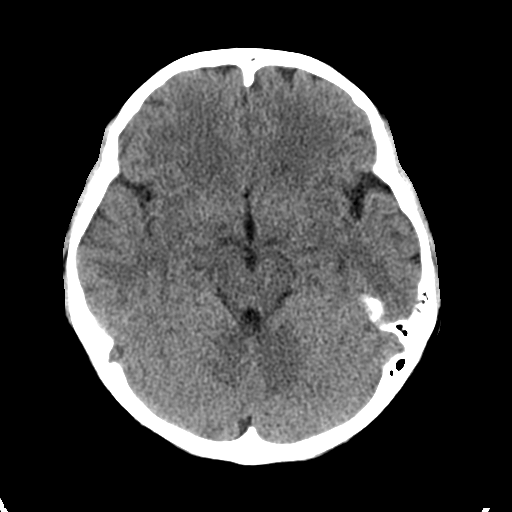
[im 11/30  brain]
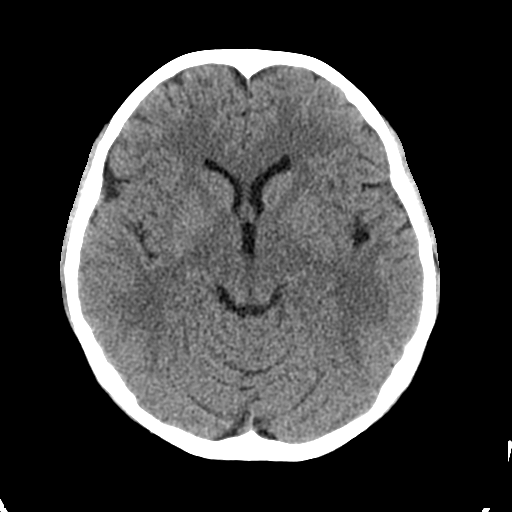
[im 14/30  brain]
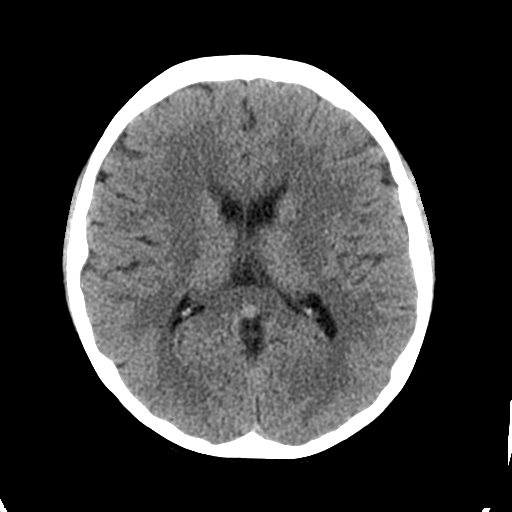
[im 14/30  bone]
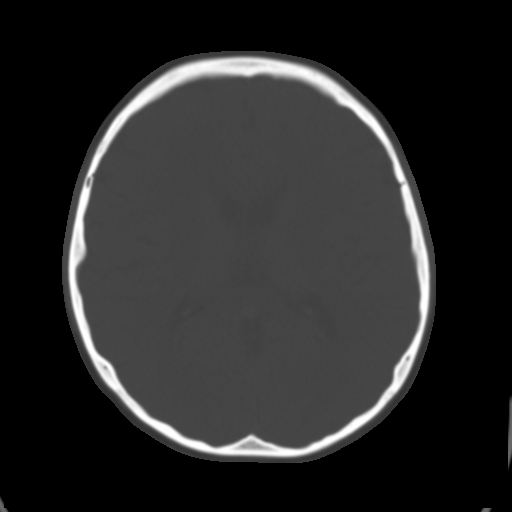
[im 17/30  brain]
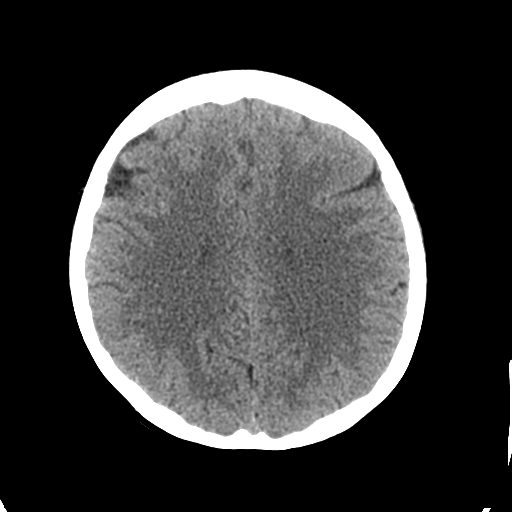
[im 20/30  brain]
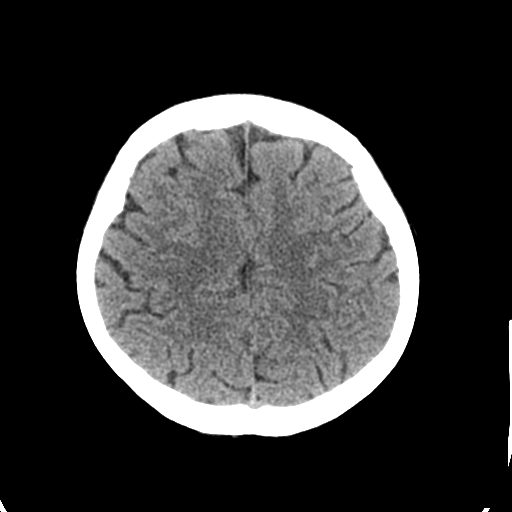
[im 23/30  brain]
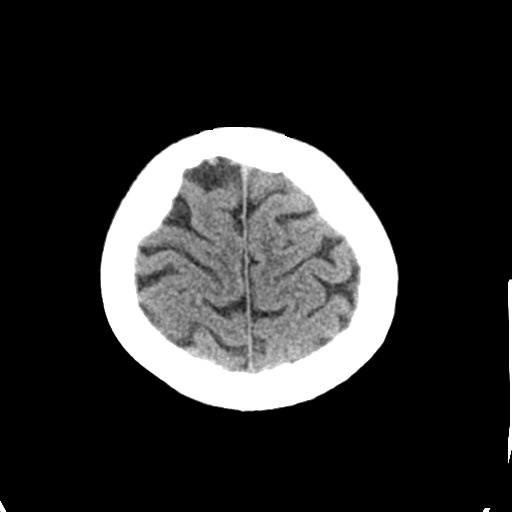
[im 25/30  brain]
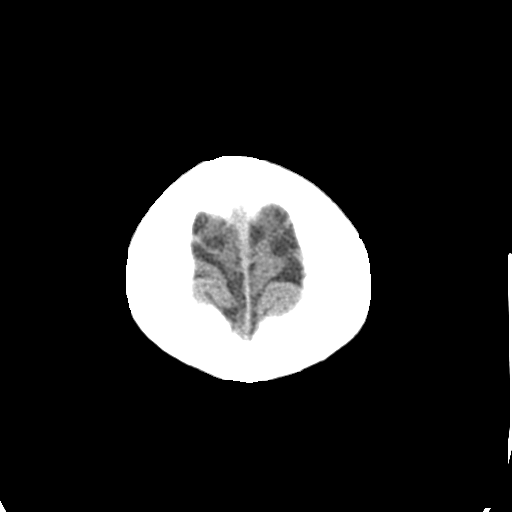
[im 25/30  bone]
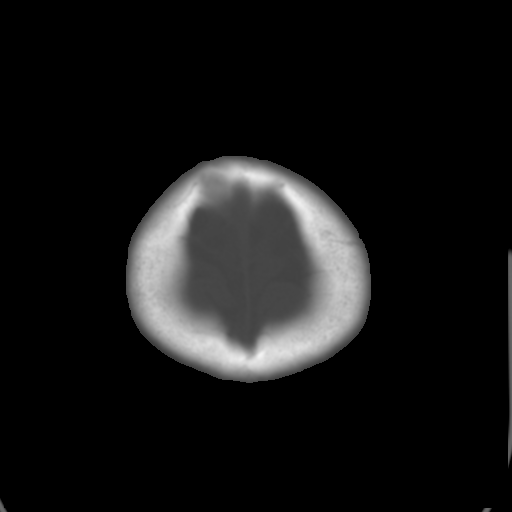
[im 28/30  brain]
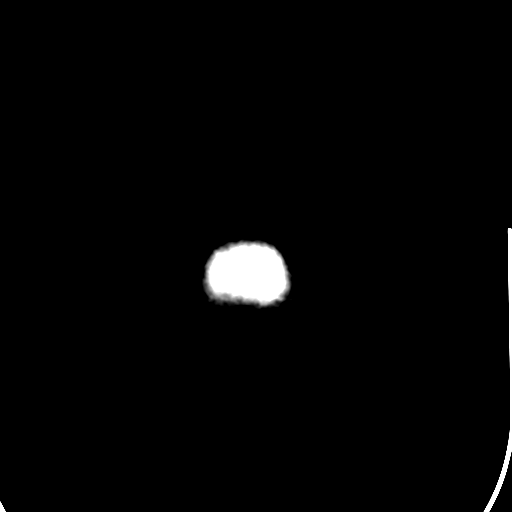

[Series 5: coronal soft tissue · coronal · 0.34mm/px · 3 of 64 slices shown]
[im 22/64  brain]
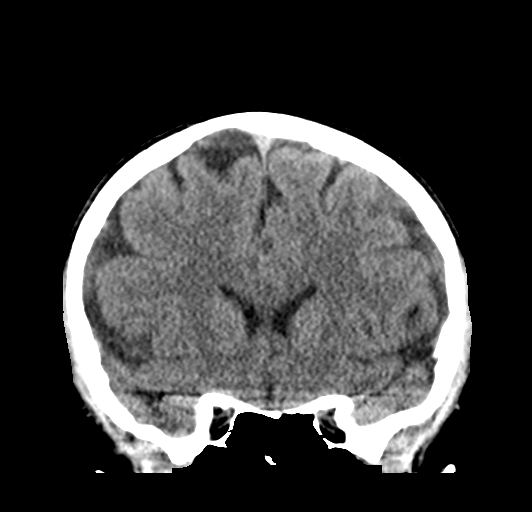
[im 29/64  brain]
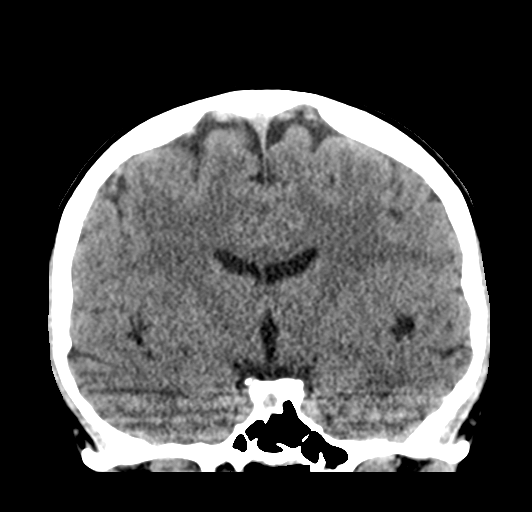
[im 36/64  brain]
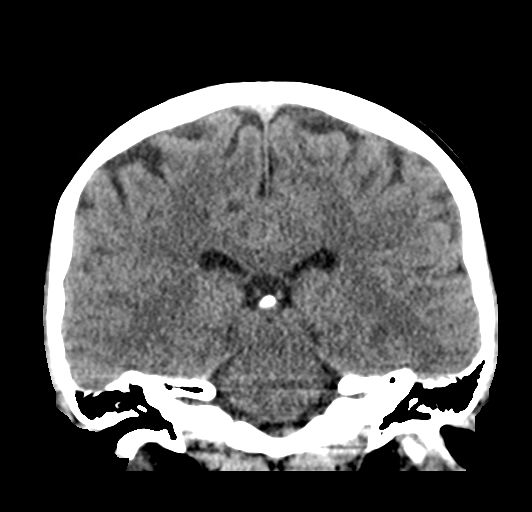

[Series 6: sagittal soft tissue · sagittal · 0.34mm/px · 3 of 62 slices shown]
[im 21/62  brain]
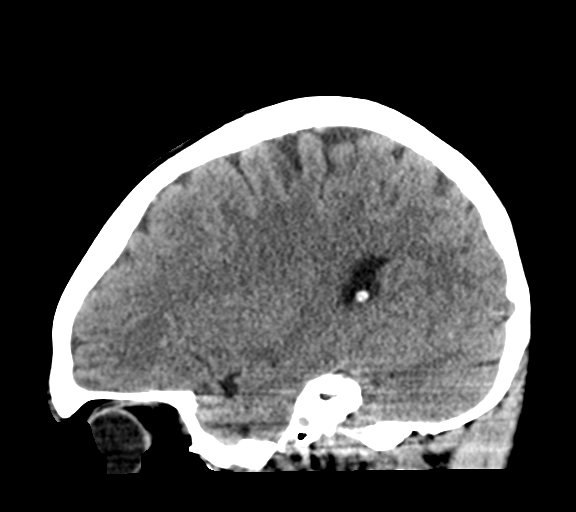
[im 31/62  brain]
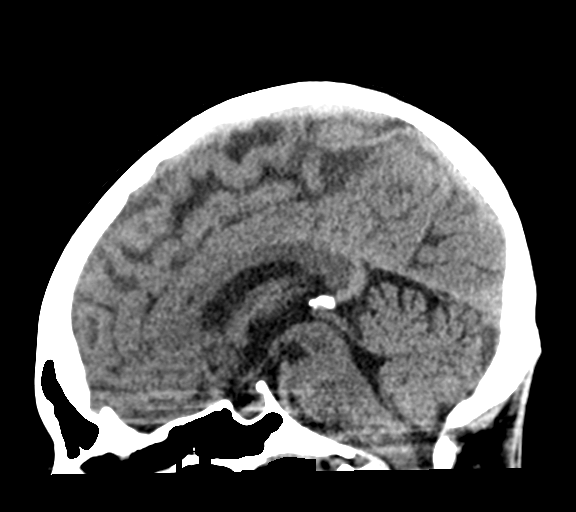
[im 41/62  brain]
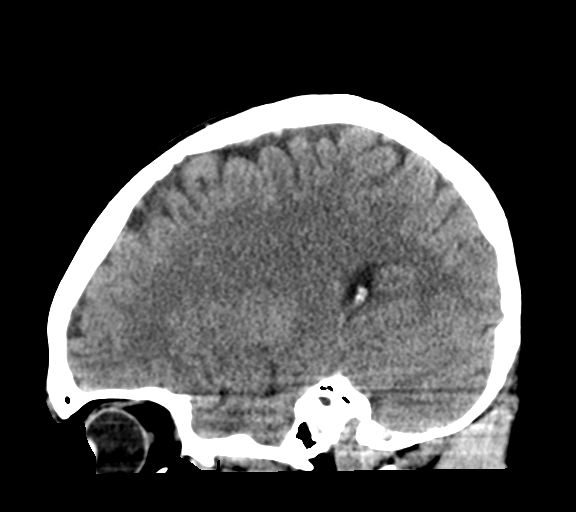

[16 of 47 positions shown; findings below may reference images not displayed]

FINDINGS: Brain: No evidence of acute infarction, hemorrhage, hydrocephalus,
extra-axial collection or mass lesion/mass effect.

Vascular: No hyperdense vessel or unexpected calcification.

Skull: Normal. Negative for fracture or focal lesion.

Sinuses/Orbits: No acute finding.

Other: None.
IMPRESSION: No acute intracranial abnormality noted.

## 2022-06-20 ENCOUNTER — Other Ambulatory Visit: Payer: Self-pay

## 2022-06-20 ENCOUNTER — Encounter (HOSPITAL_COMMUNITY): Payer: Self-pay | Admitting: *Deleted

## 2022-06-20 ENCOUNTER — Ambulatory Visit (HOSPITAL_COMMUNITY)
Admission: EM | Admit: 2022-06-20 | Discharge: 2022-06-20 | Disposition: A | Discharge status: Home / Self Care | Payer: Medicaid Other | Attending: Emergency Medicine | Admitting: Emergency Medicine

## 2022-06-20 DIAGNOSIS — H65192 Other acute nonsuppurative otitis media, left ear: Secondary | ICD-10-CM

## 2022-06-20 DIAGNOSIS — J111 Influenza due to unidentified influenza virus with other respiratory manifestations: Secondary | ICD-10-CM

## 2022-06-20 MED ORDER — AMOXICILLIN 500 MG PO CAPS: 500.0000 mg | ORAL_CAPSULE | Freq: Two times a day (BID) | ORAL | 0 refills | Status: AC

## 2022-06-20 NOTE — ED Provider Notes (Signed)
[facility]    CSN: [id number] Arrival date & time: 06/20/22  1019      History   Chief Complaint Chief Complaint  Patient presents with   Influenza   Cough   Otalgia    HPI Sherry Kim is a 21 y.o. female.  Presents with left ear pain She had positive flu test on Friday and has been taking Tamiflu. Developed left ear pain Friday.  Reports 10/10 pain.  Ibuprofen dulls it temporarily  No fevers in the last 2 days  History reviewed. No pertinent past medical history.  Patient Active Problem List   Diagnosis Date Noted   Axillary lymphadenopathy 08/10/2021   Positive ANA (antinuclear antibody) 07/21/2021   TSH elevation 07/21/2021   Facial rash 07/21/2021   Intrinsic eczema 12/22/2020   Headache 11/07/2020   Intractable migraine without aura and without status migrainosus 04/18/2019   Alternating constipation and diarrhea 04/18/2019   Irregular menstrual bleeding 04/18/2019   Tail bone pain 04/18/2019   Acute viral laryngitis 04/07/2018   Dysmenorrhea in the adolescent 10/21/2017   Lactose intolerance 10/21/2017   Elevated liver enzymes 08/30/2017   Chronic idiopathic constipation 08/29/2017   Episode of dizziness 08/29/2017   BRBPR (bright red blood per rectum) 08/29/2017    History reviewed. No pertinent surgical history.  OB History   No obstetric history on file.      Home Medications    Prior to Admission medications   Medication Sig Start Date End Date Taking? Authorizing Provider  amoxicillin (AMOXIL) 500 MG capsule Take 1 capsule (500 mg total) by mouth every 12 (twelve) hours for 5 days. 06/20/22 06/25/22 Yes Etheline Geppert, Wells Guiles, PA-C  Acetaminophen 500 MG capsule Take by mouth as needed.    [provider]    Family History Family History  Problem Relation Age of Onset   Migraines Mother    Ankylosing spondylitis Father     Social History Social History   Tobacco Use   Smoking status: Never   Smokeless tobacco:  Never  Vaping Use   Vaping Use: Never used  Substance Use Topics   Alcohol use: Never   Drug use: Never     Allergies   Patient has no known allergies.   Review of Systems Review of Systems Per HPI  Physical Exam Triage Vital Signs ED Triage Vitals  Enc Vitals Group     BP 06/20/22 1213 117/84     Pulse Rate 06/20/22 1213 (!) 105     Resp 06/20/22 1213 18     Temp 06/20/22 1213 98.6 F (37 C)     Temp src --      SpO2 06/20/22 1213 98 %     Weight --      Height --      Head Circumference --      Peak Flow --      Pain Score 06/20/22 1210 7     Pain Loc --      Pain Edu? --      Excl. in [place]? --    No data found.  Updated Vital Signs BP 117/84   Pulse (!) 105   Temp 98.6 F (37 C)   Resp 18   LMP 06/06/2022 (Approximate)   SpO2 98%    Physical Exam Vitals and nursing note reviewed.  Constitutional:      General: She is not in acute distress. HENT:     Right Ear: Tympanic membrane is injected.  Left Ear: Tympanic membrane is erythematous and bulging.     Nose: Nose normal.     Mouth/Throat:     Mouth: Mucous membranes are moist.     Pharynx: Oropharynx is clear. Posterior oropharyngeal erythema present. No oropharyngeal exudate.  Eyes:     Conjunctiva/sclera: Conjunctivae normal.  Cardiovascular:     Rate and Rhythm: Normal rate and regular rhythm.     Pulses: Normal pulses.     Heart sounds: Normal heart sounds.  Pulmonary:     Effort: Pulmonary effort is normal.     Breath sounds: Normal breath sounds.  Lymphadenopathy:     Cervical: No cervical adenopathy.  Skin:    General: Skin is warm and dry.  Neurological:     Mental Status: She is alert and oriented to person, place, and time.     UC Treatments / Results  Labs (all labs ordered are listed, but only abnormal results are displayed) Labs Reviewed - No data to display  EKG  Radiology No results found.  Procedures Procedures  Medications Ordered in UC Medications - No  data to display  Initial Impression / Assessment and Plan / UC Course  I have reviewed the triage vital signs and the nursing notes.  Pertinent labs & imaging results that were available during my care of the patient were reviewed by me and considered in my medical decision making (see chart for details).  Left otitis media Amoxicillin twice daily for 5 days.  Discussed she can continue taking the Tamiflu for her flu symptoms. Return precautions discussed. Patient agrees to plan  Final Clinical Impressions(s) / UC Diagnoses   Final diagnoses:  Other non-recurrent acute nonsuppurative otitis media of left ear  Influenza     Discharge Instructions      Please take medication as prescribed. Take with food to avoid upset stomach. Drink lots of fluids!  You can return if symptoms persist despite medication.    ED Prescriptions     Medication Sig Dispense Auth. Provider   amoxicillin (AMOXIL) 500 MG capsule Take 1 capsule (500 mg total) by mouth every 12 (twelve) hours for 5 days. 10 capsule Jarret Torre, Lurena Joiner, PA-C      PDMP not reviewed this encounter.   Kathrine Haddock 06/20/22 1349

## 2022-06-20 NOTE — ED Triage Notes (Signed)
Pt reports new Sx's that started on Friday. Pt has Lt ear pain ,cough. Pt is being treated for influenza B with Oseltamovir at this time.

## 2022-06-20 NOTE — Discharge Instructions (Addendum)
Please take medication as prescribed. Take with food to avoid upset stomach. Drink lots of fluids!  You can return if symptoms persist despite medication.

## 2022-08-29 NOTE — Progress Notes (Unsigned)
Office Visit Note  Patient: Sherry Kim             Date of Birth: 15-Jan-2001           MRN: [id number]             PCP: Idelia Salm, MD Referring: Deland Pretty* Visit Date: 08/30/2022   Subjective:  Pain of the Right Hip, Pain of the Left Hip, Pain of the Left Knee, Pain of the Right Knee, Pain of the Right Hand, and Follow-up (Patient states joint pain over the past six months.)   History of Present Illness: Sherry Kim is a 22 y.o. female here for follow up with ongoing joint pains worsening over the past 6 months and bilateral hips and bilateral knees.  She does not recall any specific events or medical changes.  Symptoms are present daily she tends to notice it more at nighttime or when lunging or descending stairs.  She does not see a lot of visible swelling most of the time but has noticed some at least in the left knee which is difficult to fully bend describes it as feeling gets around in bubble wrap limiting range of motion.  She does get occasional low back pain with radiation.  Erythematous facial rashes have been slightly more prominent still without any focal lesions pain or itching.  No new oral or nasal ulcers.  She notices occasional violaceous coloration in the toes no Raynaud's problems in her hands.  Previous HPI 02/23/22 Sherry Kim is a 21 y.o. female here for follow up for positive ANA with migraines, fatigue, joint pains, and discolorations of fingers and toes.  Overall she has been doing well.  She continues to have mild facial rashes much of the time at baseline.  Still has pallor affecting the toes during cold exposure and she feels like her feet stay cold much of the time.  Still has some joint pains usually increased with heavier use does not see any associated swelling or discoloration. Right 2nd and 3rd MCP joints occasionally tender without swelling or affect on ROM.     Previous HPI 08/10/2021 Sherry Kim is a 22 y.o. female here for follow up for positive ANA with migraines, fatigue, joint pains, and discolorations of fingers and toes. ANA was very highly positive other specific antibody markers have been negative. Lupus anticoagulant test was negative on confirmatory test. Since our last visit symptoms are about the same she still has headaches ongoing frequently, difficulty getting up and moving in the mornings, rash that feels hot or burning onto her face.   Previous HPI 07/21/21 Sherry Kim is a 22 y.o. female here for positive ANA.  She is having multiple new symptoms since early last year particularly after new headaches from striking her head on a roller coaster in April.  Is unclear whether she experienced a true concussion or just had some exacerbation of underlying headaches probably migrainous.  Before this she was generally well medical history including eczema, migraine, and possible irritable bowels.  Headaches have been the biggest problem but also experiencing worsening of fatigue and increased overall joint pains.  Intermittently symptoms including facial rashes that come and go for days at a time, pain and mild swelling under the armpits currently on the right side, spontaneous bruising on her thighs, and purple discoloration of toes. She has been hesitant to start prescription migraine medications still concerned about underlying causes with a normal brain MRI  in November.  She has associated photophobia.  Lab work-up for this in neurology clinic demonstrated the highly positive ANA antibodies.  She most often uses ibuprofen and sleeps with resolution of headache. The facial rashes come and go no association with sun exposure.  She does not experience brief flushing here or in other locations with heat or humidity.  She had eczema rashes more in the extremities but had improved before now.  She denies any noticeable alopecia.  No oral or nasal ulcers except when biting her  cheek.  Painful swelling under her armpits without any palpable nodules.  Purple discoloration in her toes when cold this improves after rewarming.  She does not see any pallor and there is no involvement of her fingers.  Spontaneous bruises have been mostly in the upper thigh without significant pain or itching sensation.  She has no history of abnormal bleeding or blood clots and has never been pregnant.   Labs reviewed 05/2021 ANA 1:2560 homogenous 1:2560 speckled dsDNA 34 SM, RNP, Ro, La neg RF neg ESR 11 TSH 6.27 Free T4 1.02 UA wnl   Review of Systems  Constitutional:  Positive for fatigue.  HENT:  Positive for mouth dryness. Negative for mouth sores.   Eyes:  Negative for dryness.  Respiratory:  Negative for shortness of breath.   Cardiovascular:  Negative for chest pain and palpitations.  Gastrointestinal:  Positive for constipation and diarrhea. Negative for blood in stool.  Endocrine: Negative for increased urination.  Genitourinary:  Negative for involuntary urination.  Musculoskeletal:  Positive for joint pain, joint pain, myalgias and myalgias. Negative for gait problem, joint swelling, muscle weakness, morning stiffness and muscle tenderness.  Skin:  Positive for rash and sensitivity to sunlight. Negative for color change and hair loss.  Allergic/Immunologic: Negative for susceptible to infections.  Neurological:  Positive for dizziness and headaches.  Hematological:  Negative for swollen glands.  Psychiatric/Behavioral:  Positive for sleep disturbance. Negative for depressed mood. The patient is nervous/anxious.     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 No pertinent past medical history.  Family History  Problem Relation Age of Onset   Migraines Mother    Ankylosing spondylitis Father    Squamous cell carcinoma Father    History reviewed. No pertinent surgical history. Social History   Social History Narrative   Not on file   Immunization History  Administered Date(s) Administered   HPV 9-valent 04/03/2014, 06/03/2014   Meningococcal Conjugate 04/03/2014     Objective: Vital Signs: BP 106/76 (BP Location: Left Arm, Patient Position: Sitting, Cuff Size: Small)   Pulse 64   Resp 16   Ht 5' 2"$  (1.575 m)   Wt 176 lb (79.8 kg)   LMP 08/19/2022   BMI 32.19 kg/m  Physical Exam HENT:     Mouth/Throat:     Mouth: Mucous membranes are moist.     Pharynx: Oropharynx is clear.     Comments: Very prominent tonsils bilaterally no erythema or exudate, no oral ulcers Eyes:     Conjunctiva/sclera: Conjunctivae normal.  Cardiovascular:     Rate and Rhythm: Normal rate and regular rhythm.  Pulmonary:     Effort: Pulmonary effort is normal.     Breath sounds: Normal breath sounds.  Musculoskeletal:     Right lower leg: No edema.     Left lower leg: No edema.  Lymphadenopathy:     Cervical: No cervical adenopathy.  Skin:    General: Skin is warm and dry.     Findings: Rash present.     Comments: Blanching faintly erythematous rash present centrally on face involving cheeks and forehead few scattered papules  Neurological:     Mental Status: She is alert.  Psychiatric:         Mood and Affect: Mood normal.      Musculoskeletal Exam:  Shoulders full ROM no tenderness or swelling Elbows slight hyperextensibility b/l, no tenderness or swelling Wrists full ROM no tenderness or swelling Fingers slight hyperextensibility, full ROM no tenderness or swelling No paraspinal muscle tenderness to palpation in the back Right-sided tenderness to pressure over SI joint and pain provoked with posterior pressure on pelvis Knee joints without significant hyperextensibility but there is increased patellar mobility no significant crepitus, positive patellar grind test, no palpable effusions   Investigation: No additional findings.  Imaging: No results found.  Recent Labs: Lab Results  Component Value Date   WBC 6.8 07/21/2021   HGB 13.8 07/21/2021   PLT 292 07/21/2021    Speciality Comments: No specialty comments available.  Procedures:  No procedures performed Allergies: Patient has no known allergies.   Assessment / Plan:     Visit Diagnoses: Positive ANA (antinuclear antibody) - Plan: Sedimentation rate, C-reactive protein, Anti-DNA antibody, double-stranded, C3 and C4  Highly positive ANA with mild double-stranded DNA antibodies previously without definite clinical criteria of disease activity.  Will recheck serum inflammatory markers also double-stranded DNA and serum complements.  If these are increased with suspect symptoms related to underlying inflammation consider trial of stronger NSAIDs versus hydroxychloroquine.  If labs are reassuring with suspect contributions from joint hypermobility.  Tail bone pain - Plan: XR Pelvis 1-2 Views  Concerned about some increase in chronic tailbone pain whether this could be contributing to increase in hip pain or radiating symptoms.  X-ray of the pelvis obtained shows possible changes of very early right-sided sacroiliitis but nonspecific I do not see anything that appears acute with the deviated coccyx.  Benign  joint hypermobility syndrome  Appears to be benign joint hypermobility definitely could be contributing to lateral hip pain and anterior knee pain especially with feeling of instability or like things are about to dislocate.  Some irregular patellar tracking suspected.  If labs are reassuring against active autoimmune disease, would refer to sports medicine clinic for evaluation and treatment.  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  Orders: Orders Placed This Encounter  Procedures   XR Pelvis 1-2 Views   Sedimentation rate   C-reactive protein   Anti-DNA antibody, double-stranded   C3 and C4   No orders of the defined types were placed in this encounter.    Follow-Up Instructions: No follow-ups on  file.   Collier Salina, MD  Note - This record has been created using Dragon software.  Chart creation errors have been sought, but may not always  have been located. Such creation errors do not reflect on  the standard of medical care.

## 2022-08-30 ENCOUNTER — Encounter: Payer: Self-pay | Admitting: Internal Medicine

## 2022-08-30 ENCOUNTER — Ambulatory Visit: Payer: Medicaid Other | Attending: Internal Medicine | Admitting: Internal Medicine

## 2022-08-30 ENCOUNTER — Ambulatory Visit (INDEPENDENT_AMBULATORY_CARE_PROVIDER_SITE_OTHER): Payer: Medicaid Other

## 2022-08-30 VITALS — BP 106/76 | HR 64 | Resp 16 | Ht 62.0 in | Wt 176.0 lb

## 2022-08-30 DIAGNOSIS — M357 Hypermobility syndrome: Secondary | ICD-10-CM | POA: Diagnosis not present

## 2022-08-30 DIAGNOSIS — R768 Other specified abnormal immunological findings in serum: Secondary | ICD-10-CM

## 2022-08-30 DIAGNOSIS — R21 Rash and other nonspecific skin eruption: Secondary | ICD-10-CM | POA: Diagnosis not present

## 2022-08-30 DIAGNOSIS — M533 Sacrococcygeal disorders, not elsewhere classified: Secondary | ICD-10-CM

## 2022-08-31 LAB — C3 AND C4
C3 Complement: 119 mg/dL (ref 83–193)
C4 Complement: 32 mg/dL (ref 15–57)

## 2022-08-31 LAB — C-REACTIVE PROTEIN: CRP: 3.7 mg/L (ref <8.0)

## 2022-08-31 LAB — ANTI-DNA ANTIBODY, DOUBLE-STRANDED: ds DNA Ab: 1 IU/mL

## 2022-08-31 LAB — SEDIMENTATION RATE: Sed Rate: 9 mm/h (ref 0–20)

## 2022-09-06 ENCOUNTER — Ambulatory Visit: Payer: Medicaid Other | Admitting: Internal Medicine

## 2022-09-06 NOTE — Progress Notes (Signed)
Inflammatory lab tests are all completely normal. My main suspicion for her joint pain may be related to mild hypermobility as discussed at exam. If agreeable we can refer her to sports medicine clinic for benign joint hypermobility recommendations.

## 2022-09-07 ENCOUNTER — Other Ambulatory Visit: Payer: Self-pay | Admitting: *Deleted

## 2022-09-07 DIAGNOSIS — M357 Hypermobility syndrome: Secondary | ICD-10-CM

## 2022-09-07 DIAGNOSIS — M533 Sacrococcygeal disorders, not elsewhere classified: Secondary | ICD-10-CM

## 2022-10-01 ENCOUNTER — Encounter: Payer: Self-pay | Admitting: Family Medicine

## 2022-10-01 ENCOUNTER — Ambulatory Visit (INDEPENDENT_AMBULATORY_CARE_PROVIDER_SITE_OTHER): Payer: Medicaid Other | Admitting: Family Medicine

## 2022-10-01 VITALS — BP 116/84 | Ht 62.0 in | Wt 171.0 lb

## 2022-10-01 DIAGNOSIS — M25562 Pain in left knee: Secondary | ICD-10-CM | POA: Diagnosis not present

## 2022-10-01 DIAGNOSIS — M25561 Pain in right knee: Secondary | ICD-10-CM

## 2022-10-01 NOTE — Progress Notes (Signed)
PCP: Idelia Salm, MD  Subjective:   HPI: Patient is a 22 y.o. female here for bilateral knee pain and right hip pain.  She is established with rheumatology Dr. Benjamine Mola for positive ANA.  She has been noted to have some joint hypermobility suspected to be benign.  Most recent office visit with rheumatology 1 month ago on 08/30/2022 notably had normal inflammatory lab testing and was felt that joint pains may be related to hypermobility.  Patient reports she has had bilateral knee pain and right hip pain for several months.  First noticed knee pain when she was doing lunges. Since then, she has had intermittent knee pain which only occurs when she is doing squats with weights or running.  She reports she would have significant pain and swelling while and after running.  She is able to ambulate and traverse stairs without issue. She also has some occasional right hip pain only with certain movements. No known injury.  Denies swelling.  Denies groin pain.  She noticed increase of knee pain and hip pain when she was training for an Iron[employer] race.  She has started running, biking, and swimming.  At baseline she does some weightlifting but does not do cardio exercise or running regularly. She has taken a break from weightlifting for the past month since finishing her Ironman race last month and has noticed some improvement.  Now doing some Pilates.  History reviewed. No pertinent past medical history.  Current Outpatient Medications on File Prior to Visit  Medication Sig Dispense Refill   Acetaminophen 500 MG capsule Take by mouth as needed.     b complex vitamins capsule Take 1 capsule by mouth daily.     No current facility-administered medications on file prior to visit.    History reviewed. No pertinent surgical history.  No Known Allergies  Social History   Socioeconomic History   Marital status: Single    Spouse name: Not on file   Number of children: Not on file    Years of education: Not on file   Highest education level: Not on file  Occupational History   Not on file  Tobacco Use   Smoking status: Never    Passive exposure: Never   Smokeless tobacco: Never  Vaping Use   Vaping Use: Never used  Substance and Sexual Activity   Alcohol use: Never   Drug use: Never   Sexual activity: Not on file  Other Topics Concern   Not on file  Social History Narrative   Not on file   Social Determinants of Health   Financial Resource Strain: Not on file  Food Insecurity: Not on file  Transportation Needs: Not on file  Physical Activity: Not on file  Stress: Not on file  Social Connections: Not on file  Intimate Partner Violence: Not on file    Family History  Problem Relation Age of Onset   Migraines Mother    Ankylosing spondylitis Father    Squamous cell carcinoma Father     BP 116/84   Ht 5\' 2"  (1.575 m)   Wt 171 lb (77.6 kg)   BMI 31.28 kg/m   Review of Systems: See HPI above.     Objective:  Physical Exam:  Gen: awake, alert, NAD, comfortable in exam room Pulm: breathing unlabored  Knees: No obvious deformity or swelling.  No tenderness to palpation along the joint line.  Normal ROM with flexion and extension.  5/5 strength with flexion and extension.  Negative varus/valgus  stressing.  Negative anterior drawer and posterior drawer.  Negative McMurray.  Right hip: No obvious swelling or deformity.  No tenderness along the greater trochanter.  Full internal and external rotation without pain.  5/5 strength with resisted flexion.  4+/5 strength with resisted abduction.  FABER test elicits some discomfort.  Beighton score 1. Passive dorsiflexion and hyperextension of the fifth MCP joint beyond 90    2 2. Passive apposition of the thumb to the flexor aspect of the forearm          2  3. Passive hyperextension of the elbow beyond 10          0 4. Passive hyperextension of the knee beyond 10        0 5. Active forward flexion of  the trunk with the knees fully extended so that the palms of the hands rest flat on the floor             1   Assessment & Plan:  1. Chronic bilateral knee pain Atraumatic bilateral knee pain ongoing for several months in the setting of increasing physical activity.  Primarily worse with squatting and running.  Pain has improved since she stopped weightlifting and running.  Suspect this is probably a component of patellofemoral syndrome or meniscal irritation when squatting.  Hypermobility could be a contributor as well. - quad strengthening home exercises - advised to stop squatting, can gradually resume running if desired  2. Chronic right hip pain Seems to be mild and infrequent.  Exam and history not consistent with intra-articular pathology.  Could be some element of mild SI joint dysfunction.  I think this will improve with more rest and we will observe for now.  3. Hypermobility Suspected to be benign hypermobility.  Beighton score of 5. - home exercises as above, could consider PT referral if joint pains not improving  Follow-up as needed  Zola Button, MD [place], PGY-3

## 2022-12-06 ENCOUNTER — Ambulatory Visit (INDEPENDENT_AMBULATORY_CARE_PROVIDER_SITE_OTHER): Payer: Medicaid Other | Admitting: Emergency Medicine

## 2022-12-06 ENCOUNTER — Encounter: Payer: Self-pay | Admitting: Emergency Medicine

## 2022-12-06 VITALS — BP 110/72 | HR 85 | Temp 98.1°F | Ht 62.0 in | Wt 174.5 lb

## 2022-12-06 DIAGNOSIS — R300 Dysuria: Secondary | ICD-10-CM | POA: Insufficient documentation

## 2022-12-06 DIAGNOSIS — Z7689 Persons encountering health services in other specified circumstances: Secondary | ICD-10-CM

## 2022-12-06 DIAGNOSIS — L989 Disorder of the skin and subcutaneous tissue, unspecified: Secondary | ICD-10-CM | POA: Insufficient documentation

## 2022-12-06 DIAGNOSIS — N390 Urinary tract infection, site not specified: Secondary | ICD-10-CM | POA: Diagnosis not present

## 2022-12-06 LAB — POCT URINALYSIS DIPSTICK
Bilirubin, UA: NEGATIVE
Blood, UA: POSITIVE
Glucose, UA: NEGATIVE
Ketones, UA: NEGATIVE
Nitrite, UA: POSITIVE
Protein, UA: POSITIVE — AB
Spec Grav, UA: 1.02 (ref 1.010–1.025)
Urobilinogen, UA: 0.2 E.U./dL
pH, UA: 6 (ref 5.0–8.0)

## 2022-12-06 MED ORDER — CEFUROXIME AXETIL 250 MG PO TABS: 250.0000 mg | ORAL_TABLET | Freq: Two times a day (BID) | ORAL | 0 refills | Status: AC

## 2022-12-06 NOTE — Assessment & Plan Note (Signed)
Clinically stable.  Positive urinalysis. Urine sent for culture. No signs of pyelonephritis. Recommend to start Ceftin 250 mg twice a day for 7 days

## 2022-12-06 NOTE — Patient Instructions (Signed)
Urinary Tract Infection, Adult A urinary tract infection (UTI) is an infection of any part of the urinary tract. The urinary tract includes: The kidneys. The ureters. The bladder. The urethra. These organs make, store, and get rid of pee (urine) in the body. What are the causes? This infection is caused by germs (bacteria) in your genital area. These germs grow and cause swelling (inflammation) of your urinary tract. What increases the risk? The following factors may make you more likely to develop this condition: Using a small, thin tube (catheter) to drain pee. Not being able to control when you pee or poop (incontinence). Being female. If you are female, these things can increase the risk: Using these methods to prevent pregnancy: A medicine that kills sperm (spermicide). A device that blocks sperm (diaphragm). Having low levels of a female hormone (estrogen). Being pregnant. You are more likely to develop this condition if: You have genes that add to your risk. You are sexually active. You take antibiotic medicines. You have trouble peeing because of: A prostate that is bigger than normal, if you are female. A blockage in the part of your body that drains pee from the bladder. A kidney stone. A nerve condition that affects your bladder. Not getting enough to drink. Not peeing often enough. You have other conditions, such as: Diabetes. A weak disease-fighting system (immune system). Sickle cell disease. Gout. Injury of the spine. What are the signs or symptoms? Symptoms of this condition include: Needing to pee right away. Peeing small amounts often. Pain or burning when peeing. Blood in the pee. Pee that smells bad or not like normal. Trouble peeing. Pee that is cloudy. Fluid coming from the vagina, if you are female. Pain in the belly or lower back. Other symptoms include: Vomiting. Not feeling hungry. Feeling mixed up (confused). This may be the first symptom in  older adults. Being tired and grouchy (irritable). A fever. Watery poop (diarrhea). How is this treated? Taking antibiotic medicine. Taking other medicines. Drinking enough water. In some cases, you may need to see a specialist. Follow these instructions at home:  Medicines Take over-the-counter and prescription medicines only as told by your doctor. If you were prescribed an antibiotic medicine, take it as told by your doctor. Do not stop taking it even if you start to feel better. General instructions Make sure you: Pee until your bladder is empty. Do not hold pee for a long time. Empty your bladder after sex. Wipe from front to back after peeing or pooping if you are a female. Use each tissue one time when you wipe. Drink enough fluid to keep your pee pale yellow. Keep all follow-up visits. Contact a doctor if: You do not get better after 1-2 days. Your symptoms go away and then come back. Get help right away if: You have very bad back pain. You have very bad pain in your lower belly. You have a fever. You have chills. You feeling like you will vomit or you vomit. Summary A urinary tract infection (UTI) is an infection of any part of the urinary tract. This condition is caused by germs in your genital area. There are many risk factors for a UTI. Treatment includes antibiotic medicines. Drink enough fluid to keep your pee pale yellow. This information is not intended to replace advice given to you by your health care provider. Make sure you discuss any questions you have with your health care provider. Document Revised: 02/15/2020 Document Reviewed: 02/15/2020 Elsevier Patient Education    2023 [employer]  

## 2022-12-06 NOTE — Assessment & Plan Note (Signed)
Advised to start antibiotics, stay well-hydrated, and take over-the-counter Azo as needed for burning

## 2022-12-06 NOTE — Progress Notes (Signed)
Sherry Kim 22 y.o.   Chief Complaint  Patient presents with   New Patient (Initial Visit)    Burning during urination    Referral    Patient has some moles she wants Derm to look at.     HISTORY OF PRESENT ILLNESS: This is a 22 y.o. female first visit to this office, here to establish care Complaining of burning on urination for 2 days Healthy female with a healthy lifestyle No chronic medical problems.  No chronic medications Non-smoker.  HPI   Prior to Admission medications   Medication Sig Start Date End Date Taking? Authorizing Provider  b complex vitamins capsule Take 1 capsule by mouth daily.   Yes [provider]  liothyronine (CYTOMEL) 5 MCG tablet Take by mouth. 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  No past surgical history on file.  Social History   Socioeconomic History   Marital status: Single    Spouse name: Not on file   Number of children: Not on file   Years of education: Not on file   Highest education level: Not on file  Occupational History   Not on file  Tobacco Use   Smoking status: Never    Passive exposure: Never   Smokeless tobacco: Never  Vaping Use   Vaping Use: Never used  Substance and Sexual Activity   Alcohol use: Never   Drug use: Never   Sexual activity: Not on file  Other Topics Concern   Not on file  Social History Narrative   Not on file   Social Determinants of Health   Financial Resource Strain: Not on file  Food Insecurity: Not on file  Transportation Needs: Not on file  Physical Activity: Not on file  Stress: Not on file  Social Connections: Not on file  Intimate  Partner Violence: Not on file    Family History  Problem Relation Age of Onset   Migraines Mother    Ankylosing spondylitis Father    Squamous cell carcinoma Father      Review of Systems  Constitutional: Negative.  Negative for chills and fever.  HENT: Negative.  Negative for congestion.   Respiratory: Negative.  Negative for cough and shortness of breath.   Cardiovascular: Negative.  Negative for chest pain and palpitations.  Gastrointestinal:  Negative for abdominal pain, diarrhea, nausea and vomiting.  Genitourinary:  Positive for dysuria and frequency. Negative for hematuria.  Skin:        Multiple skin lesions  Neurological: Negative.  Negative for dizziness and headaches.  All other systems reviewed and are negative.   Vitals:   12/06/22 1320  BP: 110/72  Pulse: 85  Temp: 98.1 F (36.7 C)  SpO2: 98%    Physical Exam Vitals reviewed.  Constitutional:      Appearance: Normal appearance.  HENT:     Head: Normocephalic.  Eyes:     Extraocular Movements: Extraocular movements intact.     Pupils: Pupils are equal, round, and reactive to light.  Cardiovascular:     Rate and Rhythm: Normal rate and regular rhythm.     Pulses: Normal pulses.     Heart sounds: Normal heart sounds.  Pulmonary:     Effort: Pulmonary effort is normal.     Breath sounds: Normal breath sounds.  Abdominal:     Tenderness: There is no right CVA tenderness or left CVA tenderness.  Musculoskeletal:     Cervical back: No tenderness.  Lymphadenopathy:     Cervical: No cervical adenopathy.  Skin:    General: Skin is warm and dry.     Capillary Refill: Capillary refill takes less than 2 seconds.  Neurological:     General: No focal deficit present.     Mental Status: She is alert and oriented to person, place, and time.  Psychiatric:        Mood and Affect: Mood normal.        Behavior: Behavior normal.    Results for orders placed or performed in visit on 12/06/22 (from the past 24  hour(s))  POCT Urinalysis Dipstick     Status: Abnormal   Collection Time: 12/06/22  1:58 PM  Result Value Ref Range   Color, UA amber    Clarity, UA cloudy    Glucose, UA Negative Negative   Bilirubin, UA negative    Ketones, UA negative    Spec Grav, UA 1.020 1.010 - 1.025   Blood, UA positive    pH, UA 6.0 5.0 - 8.0   Protein, UA Positive (A) Negative   Urobilinogen, UA 0.2 0.2 or 1.0 E.U./dL   Nitrite, UA positive    Leukocytes, UA Small (1+) (A) Negative   Appearance     Odor       ASSESSMENT & PLAN: A total of 45 minutes was spent with the patient and counseling/coordination of care regarding preparing for this visit, review of available medical records, establishing care with me, comprehensive history and physical exam, diagnosis of acute urinary tract infection and need to start antibiotics, prognosis, documentation, need for follow-up  Problem List Items Addressed This Visit       Musculoskeletal and Integument   Benign skin lesion of multiple sites   Relevant Orders   Ambulatory referral to Dermatology     Genitourinary   Acute UTI - Primary    Clinically stable.  Positive urinalysis. Urine sent for culture. No signs of pyelonephritis. Recommend to start Ceftin 250 mg twice a day for 7 days      Relevant Medications   cefUROXime (CEFTIN) 250 MG tablet   Other Relevant Orders   POCT Urinalysis Dipstick   Urine Culture     Other   Dysuria    Advised to start antibiotics, stay well-hydrated, and take over-the-counter Azo as needed for burning      Other Visit Diagnoses     Encounter to establish care          Patient Instructions  Urinary Tract Infection, Adult A urinary tract infection (UTI) is an infection of any part of the urinary tract. The urinary tract includes: The kidneys. The ureters. The bladder. The urethra. These organs make, store, and get rid of pee (urine) in the body. What are the causes? This infection is caused by germs  (bacteria) in your genital area. These germs grow and cause swelling (inflammation) of your urinary tract. What increases the risk? The following factors may make you more likely to develop this condition: Using a small, thin tube (catheter) to drain pee. Not being able to control when you pee or poop (incontinence). Being female. If you are female, these things can increase the risk: Using these methods to prevent  pregnancy: A medicine that kills sperm (spermicide). A device that blocks sperm (diaphragm). Having low levels of a female hormone (estrogen). Being pregnant. You are more likely to develop this condition if: You have genes that add to your risk. You are sexually active. You take antibiotic medicines. You have trouble peeing because of: A prostate that is bigger than normal, if you are female. A blockage in the part of your body that drains pee from the bladder. A kidney stone. A nerve condition that affects your bladder. Not getting enough to drink. Not peeing often enough. You have other conditions, such as: Diabetes. A weak disease-fighting system (immune system). Sickle cell disease. Gout. Injury of the spine. What are the signs or symptoms? Symptoms of this condition include: Needing to pee right away. Peeing small amounts often. Pain or burning when peeing. Blood in the pee. Pee that smells bad or not like normal. Trouble peeing. Pee that is cloudy. Fluid coming from the vagina, if you are female. Pain in the belly or lower back. Other symptoms include: Vomiting. Not feeling hungry. Feeling mixed up (confused). This may be the first symptom in older adults. Being tired and grouchy (irritable). A fever. Watery poop (diarrhea). How is this treated? Taking antibiotic medicine. Taking other medicines. Drinking enough water. In some cases, you may need to see a specialist. Follow these instructions at home:  Medicines Take over-the-counter and  prescription medicines only as told by your doctor. If you were prescribed an antibiotic medicine, take it as told by your doctor. Do not stop taking it even if you start to feel better. General instructions Make sure you: Pee until your bladder is empty. Do not hold pee for a long time. Empty your bladder after sex. Wipe from front to back after peeing or pooping if you are a female. Use each tissue one time when you wipe. Drink enough fluid to keep your pee pale yellow. Keep all follow-up visits. Contact a doctor if: You do not get better after 1-2 days. Your symptoms go away and then come back. Get help right away if: You have very bad back pain. You have very bad pain in your lower belly. You have a fever. You have chills. You feeling like you will vomit or you vomit. Summary A urinary tract infection (UTI) is an infection of any part of the urinary tract. This condition is caused by germs in your genital area. There are many risk factors for a UTI. Treatment includes antibiotic medicines. Drink enough fluid to keep your pee pale yellow. This information is not intended to replace advice given to you by your health care provider. Make sure you discuss any questions you have with your health care provider. Document Revised: 02/15/2020 Document Reviewed: 02/15/2020 Elsevier Patient Education  2023 Elsevier Inc.    Edwina Barth, MD [facility], [place]

## 2022-12-09 LAB — URINE CULTURE

## 2023-02-16 ENCOUNTER — Ambulatory Visit: Payer: Medicaid Other | Admitting: Dermatology

## 2023-02-17 IMAGING — US US AXILLARY RIGHT
2 series · 13 of 13 positions shown · non-contrast
Comparison: None.

CLINICAL DATA: 20-year-old female presenting for evaluation of
constant right axillary pain for 6 months. At the same time, she has
had additional symptoms of new migraines and dizziness. She has been
evaluated by neurology and rheumatology. No clear cause has been
identified.

EXAM:
ULTRASOUND OF THE RIGHT AXILLA

[Series 1: us axillary right · 0.07mm/px · 11 of 11 slices shown (1 of 2)]
[im 1/11]
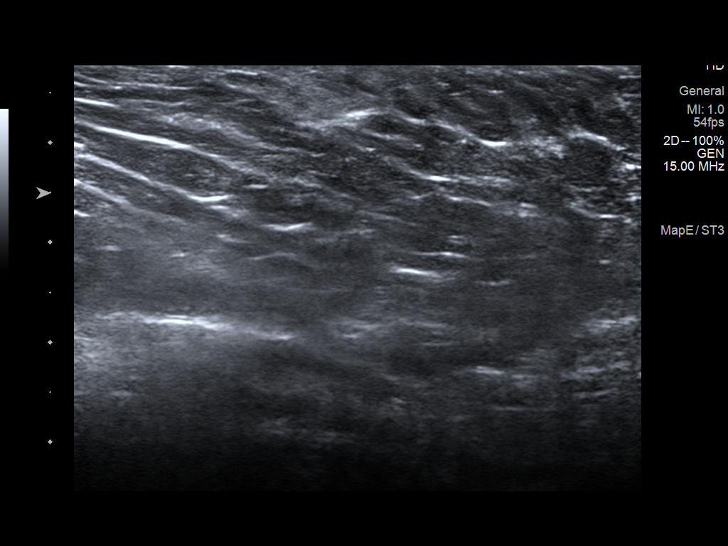
[im 2/11]
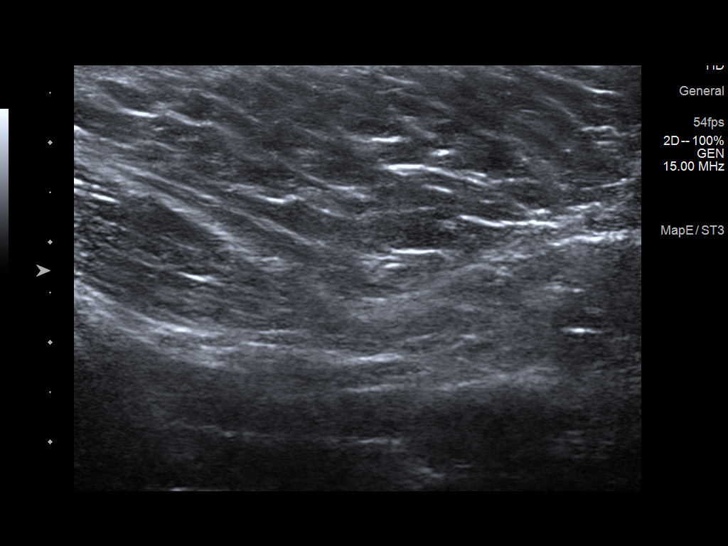
[im 3/11]
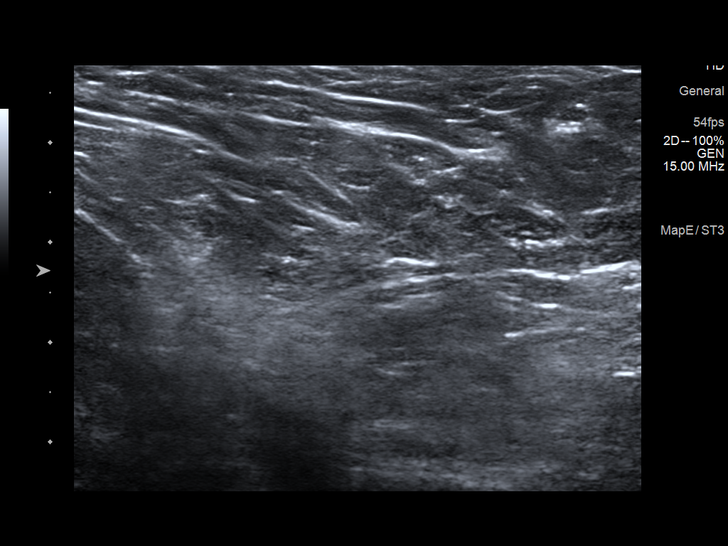
[im 4/11]
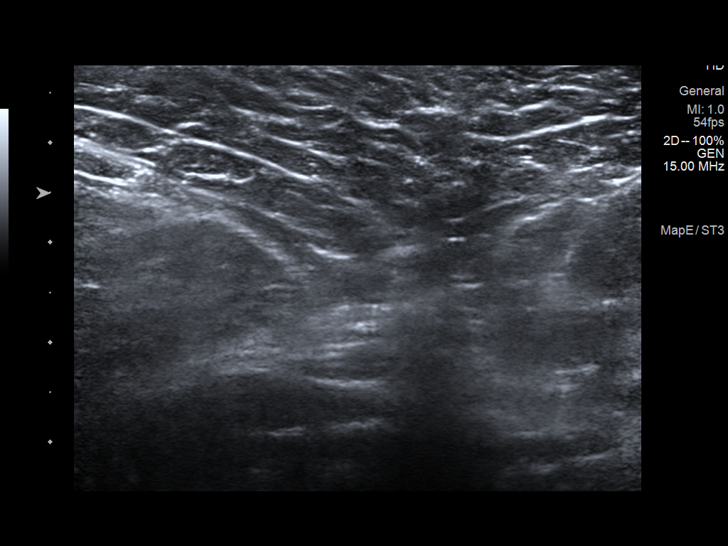
[im 5/11]
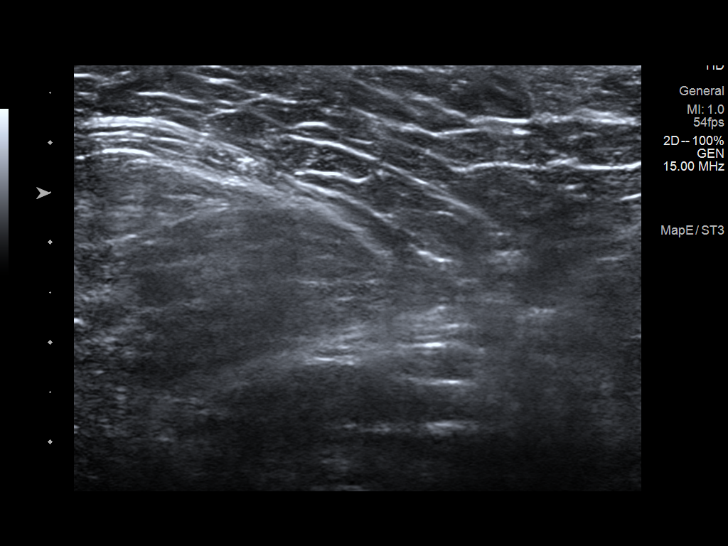
[im 6/11]
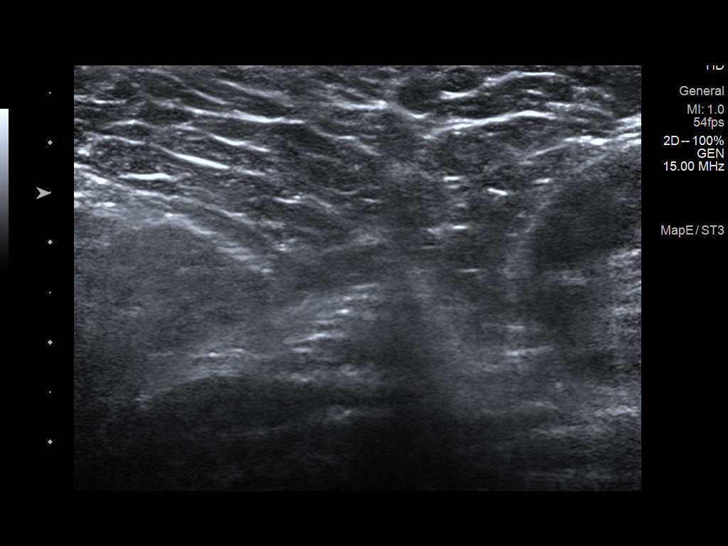
[im 7/11]
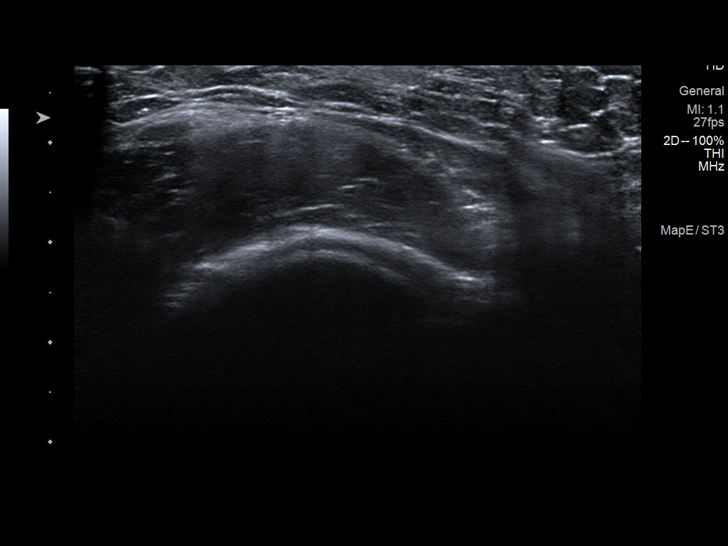
[im 8/11]
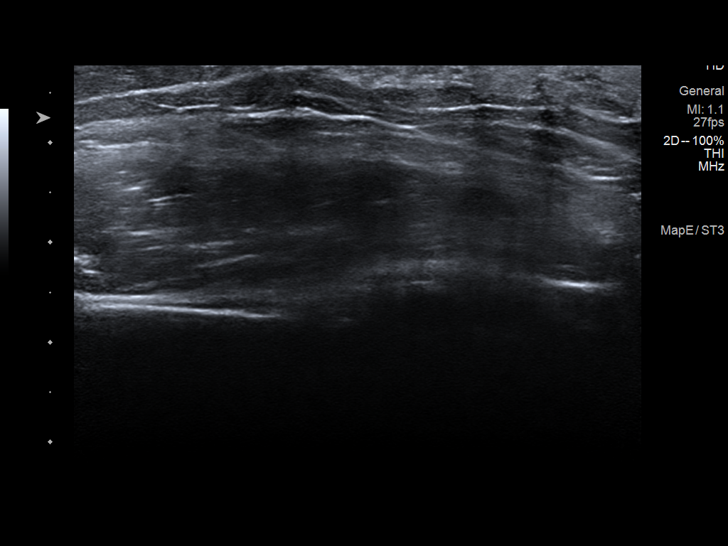
[im 9/11]
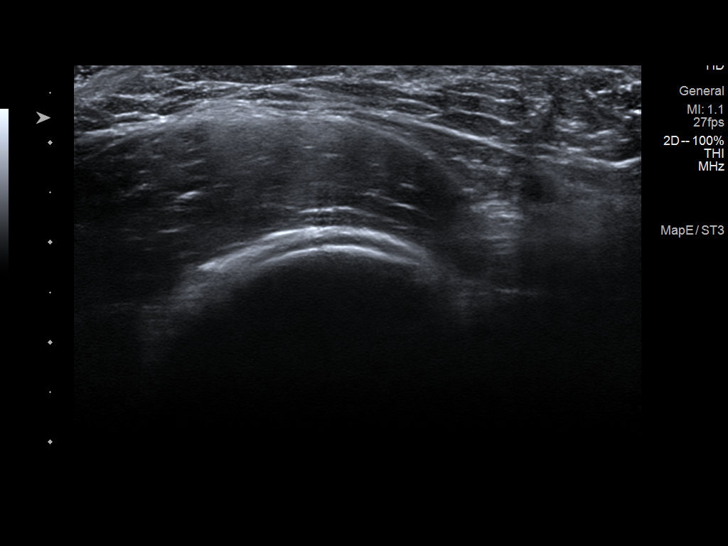
[im 10/11]
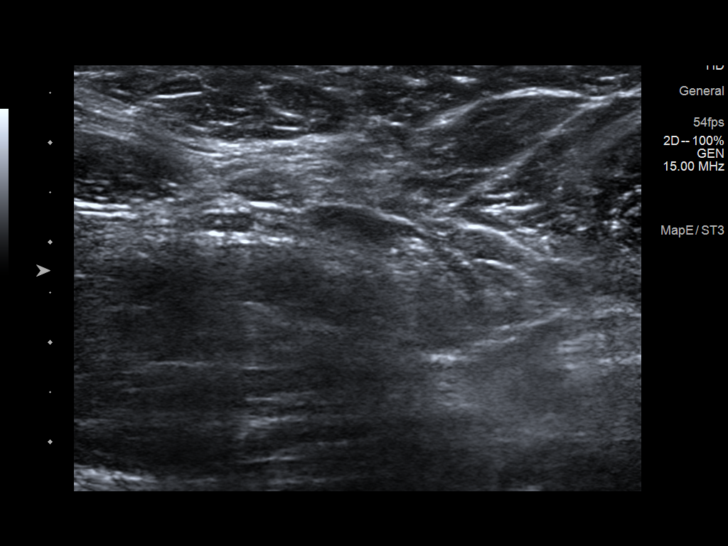
[im 11/11]
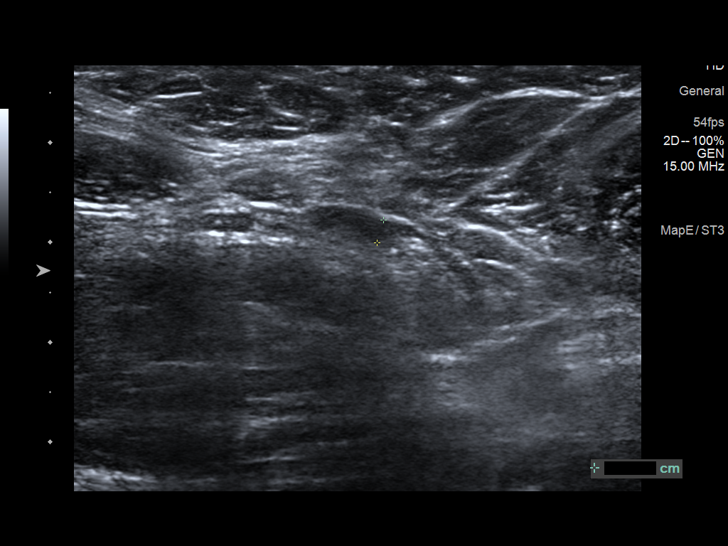

[Series 2: us axillary right · 0.07mm/px · 2 of 2 slices shown (2 of 2)]
[im 1/2]
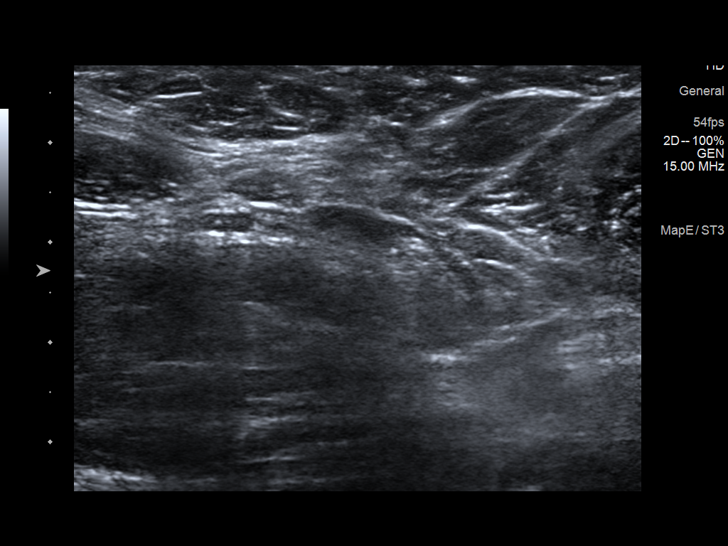
[im 2/2]
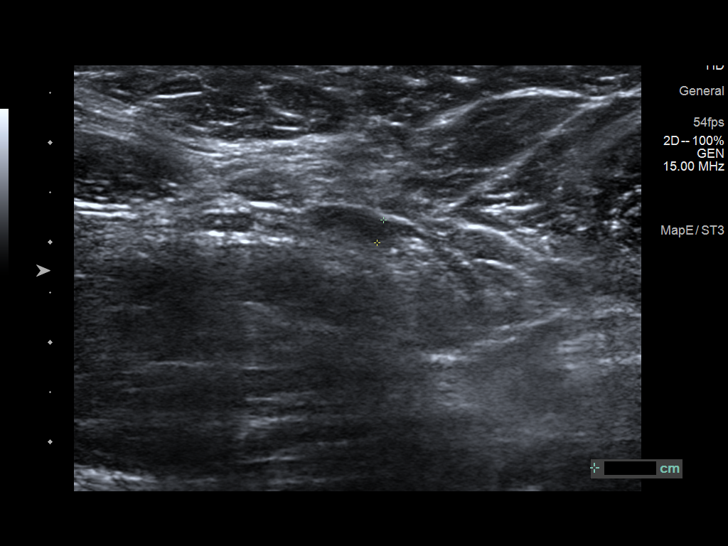

[13 of 13 positions shown; findings below may reference images not displayed]

FINDINGS: On physical exam,no suspicious palpable masses are identified in the
right axilla. Tenderness is elicited with palpation.

Ultrasound is performed, showing normal subcutaneous tissue. Normal
lymph nodes are identified.
IMPRESSION: No targeted sonographic abnormalities in the right axilla.

RECOMMENDATION:
Continued clinical follow-up for symptoms in the right axilla.

I have discussed the findings and recommendations with the patient.
If applicable, a reminder letter will be sent to the patient
regarding the next appointment.

BI-RADS CATEGORY  1: Negative.

## 2023-02-17 NOTE — Progress Notes (Deleted)
Office Visit Note  Patient: Sherry Kim             Date of Birth: 05-Dec-2000           MRN: [id number]             PCP: Georgina Quint, MD Referring: Rinaldo Cloud* Visit Date: 02/23/2023   Subjective:  No chief complaint on file.   History of Present Illness: Sherry Kim is a 22 y.o. female here for follow up with ongoing joint pains worsening over the past 6 months and bilateral hips and bilateral knees.    Previous HPI 08/30/2022 Sherry Kim is a 22 y.o. female here for follow up with ongoing joint pains worsening over the past 6 months and bilateral hips and bilateral knees.  She does not recall any specific events or medical changes.  Symptoms are present daily she tends to notice it more at nighttime or when lunging or descending stairs.  She does not see a lot of visible swelling most of the time but has noticed some at least in the left knee which is difficult to fully bend describes it as feeling gets around in bubble wrap limiting range of motion.  She does get occasional low back pain with radiation.  Erythematous facial rashes have been slightly more prominent still without any focal lesions pain or itching.  No new oral or nasal ulcers.  She notices occasional violaceous coloration in the toes no Raynaud's problems in her hands.   Previous HPI 02/23/22 Sherry Kim is a 22 y.o. female here for follow up for positive ANA with migraines, fatigue, joint pains, and discolorations of fingers and toes.  Overall she has been doing well.  She continues to have mild facial rashes much of the time at baseline.  Still has pallor affecting the toes during cold exposure and she feels like her feet stay cold much of the time.  Still has some joint pains usually increased with heavier use does not see any associated swelling or discoloration. Right 2nd and 3rd MCP joints occasionally tender without swelling or affect on ROM.     Previous  HPI 08/10/2021 Sherry Kim is a 22 y.o. female here for follow up for positive ANA with migraines, fatigue, joint pains, and discolorations of fingers and toes. ANA was very highly positive other specific antibody markers have been negative. Lupus anticoagulant test was negative on confirmatory test. Since our last visit symptoms are about the same she still has headaches ongoing frequently, difficulty getting up and moving in the mornings, rash that feels hot or burning onto her face.   Previous HPI 07/21/21 Sherry Kim is a 22 y.o. female here for positive ANA.  She is having multiple new symptoms since early last year particularly after new headaches from striking her head on a roller coaster in April.  Is unclear whether she experienced a true concussion or just had some exacerbation of underlying headaches probably migrainous.  Before this she was generally well medical history including eczema, migraine, and possible irritable bowels.  Headaches have been the biggest problem but also experiencing worsening of fatigue and increased overall joint pains.  Intermittently symptoms including facial rashes that come and go for days at a time, pain and mild swelling under the armpits currently on the right side, spontaneous bruising on her thighs, and purple discoloration of toes. She has been hesitant to start prescription migraine medications still concerned about underlying causes with  a normal brain MRI in November.  She has associated photophobia.  Lab work-up for this in neurology clinic demonstrated the highly positive ANA antibodies.  She most often uses ibuprofen and sleeps with resolution of headache. The facial rashes come and go no association with sun exposure.  She does not experience brief flushing here or in other locations with heat or humidity.  She had eczema rashes more in the extremities but had improved before now.  She denies any noticeable alopecia.  No oral or nasal ulcers  except when biting her cheek.  Painful swelling under her armpits without any palpable nodules.  Purple discoloration in her toes when cold this improves after rewarming.  She does not see any pallor and there is no involvement of her fingers.  Spontaneous bruises have been mostly in the upper thigh without significant pain or itching sensation.  She has no history of abnormal bleeding or blood clots and has never been pregnant.   Labs reviewed 05/2021 ANA 1:2560 homogenous 1:2560 speckled dsDNA 34 SM, RNP, Ro, La neg RF neg ESR 11 TSH 6.27 Free T4 1.02 UA wnl   No Rheumatology ROS completed.   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  Family History  Problem Relation Age of Onset   Migraines Mother    Ankylosing spondylitis Father    Squamous cell carcinoma Father    No past surgical history on file. Social History   Social History Narrative   Not on file   Immunization History  Administered Date(s) Administered   HPV 9-valent 04/03/2014, 06/03/2014   Meningococcal Conjugate 04/03/2014     Objective: Vital Signs: There were no vitals taken for this visit.   Physical Exam   Musculoskeletal Exam: ***  CDAI Exam: CDAI Score: -- Patient Global: --; Provider Global: -- Swollen: --; Tender: -- Joint Exam 02/23/2023   No joint exam has been documented for this visit   There is currently no information documented on the homunculus. Go to the Rheumatology activity and complete the homunculus joint exam.  Investigation: No additional findings.  Imaging: No results found.  Recent Labs: Lab  Results  Component Value Date   WBC 6.8 07/21/2021   HGB 13.8 07/21/2021   PLT 292 07/21/2021    Speciality Comments: No specialty comments available.  Procedures:  No procedures performed Allergies: Patient has no known allergies.   Assessment / Plan:     Visit Diagnoses: No diagnosis found.  ***  Orders: No orders of the defined types were placed in this encounter.  No orders of the defined types were placed in this encounter.    Follow-Up Instructions: No follow-ups on file.   Metta Clines, RT  Note - This record has been created using [employer].  Chart creation errors have been sought, but may not always  have been located. Such creation errors do not reflect on  the standard of medical care.

## 2023-02-23 ENCOUNTER — Ambulatory Visit: Payer: Medicaid Other | Admitting: Internal Medicine

## 2023-02-23 DIAGNOSIS — M357 Hypermobility syndrome: Secondary | ICD-10-CM

## 2023-02-23 DIAGNOSIS — M533 Sacrococcygeal disorders, not elsewhere classified: Secondary | ICD-10-CM

## 2023-02-23 DIAGNOSIS — R21 Rash and other nonspecific skin eruption: Secondary | ICD-10-CM

## 2023-02-23 DIAGNOSIS — R768 Other specified abnormal immunological findings in serum: Secondary | ICD-10-CM

## 2023-03-07 ENCOUNTER — Encounter: Payer: Self-pay | Admitting: Dermatology

## 2023-03-07 ENCOUNTER — Ambulatory Visit (INDEPENDENT_AMBULATORY_CARE_PROVIDER_SITE_OTHER): Payer: Medicaid Other | Admitting: Dermatology

## 2023-03-07 VITALS — BP 152/98 | HR 68

## 2023-03-07 DIAGNOSIS — D225 Melanocytic nevi of trunk: Secondary | ICD-10-CM

## 2023-03-07 DIAGNOSIS — L509 Urticaria, unspecified: Secondary | ICD-10-CM

## 2023-03-07 DIAGNOSIS — D1801 Hemangioma of skin and subcutaneous tissue: Secondary | ICD-10-CM

## 2023-03-07 DIAGNOSIS — L821 Other seborrheic keratosis: Secondary | ICD-10-CM

## 2023-03-07 DIAGNOSIS — Z1283 Encounter for screening for malignant neoplasm of skin: Secondary | ICD-10-CM

## 2023-03-07 DIAGNOSIS — L578 Other skin changes due to chronic exposure to nonionizing radiation: Secondary | ICD-10-CM

## 2023-03-07 DIAGNOSIS — W908XXA Exposure to other nonionizing radiation, initial encounter: Secondary | ICD-10-CM

## 2023-03-07 DIAGNOSIS — D229 Melanocytic nevi, unspecified: Secondary | ICD-10-CM

## 2023-03-07 DIAGNOSIS — L814 Other melanin hyperpigmentation: Secondary | ICD-10-CM

## 2023-03-07 DIAGNOSIS — D485 Neoplasm of uncertain behavior of skin: Secondary | ICD-10-CM

## 2023-03-07 NOTE — Patient Instructions (Addendum)

## 2023-03-07 NOTE — Progress Notes (Signed)
   New Patient Visit   Subjective  Sherry Kim is a 22 y.o. female who presents for the following: TBSE  Patient present today for new patient visit for TBSE. Patient reports throughout her lifetime  has had moderate sun exposure. Currently, patient reports if she has excessive sun exposure, She does apply sunscreen and/or wears protective coverings. Patient denies Hx of bx. Patient denies family history of skin cancer(s).   The following portions of the chart were reviewed this encounter and updated as appropriate: medications, allergies, medical history  Review of Systems:  No other skin or systemic complaints except as noted in HPI or Assessment and Plan.  Objective  Well appearing patient in no apparent distress; mood and affect are within normal limits.  A full examination was performed including scalp, head, eyes, ears, nose, lips, neck, chest, axillae, abdomen, back, buttocks, bilateral upper extremities, bilateral lower extremities, hands, feet, fingers, toes, fingernails, and toenails. All findings within normal limits unless otherwise noted below.   Relevant exam findings are noted in the Assessment and Plan.    Assessment & Plan   LENTIGINES, SEBORRHEIC KERATOSES, HEMANGIOMAS - Benign normal skin lesions - Benign-appearing - Call for any changes - Recommend Vitamin C Serum and  applying Sunscreen with SPF 30  Congential Nevus Exam: 2.5 x 2 cm brown patch  Treatment: -  Benign appearing - Advised to monitor and call if changes  Intradermal Nevi and MELANOCYTIC NEVI - Tan-brown and/or pink-flesh-colored symmetric macules and papules - Benign appearing on exam today - Observation - Call clinic for new or changing moles - Recommend daily use of broad spectrum spf 30+ sunscreen to sun-exposed areas.   ACTINIC DAMAGE - Chronic condition, secondary to cumulative UV/sun exposure - diffuse scaly erythematous macules with underlying dyspigmentation - Recommend  daily broad spectrum sunscreen SPF 30+ to sun-exposed areas, reapply every 2 hours as needed.  - Staying in the shade or wearing long sleeves, sun glasses (UVA+UVB protection) and wide brim hats (4-inch brim around the entire circumference of the hat) are also recommended for sun protection.  - Call for new or changing lesions.  SKIN CANCER SCREENING PERFORMED TODAY  URTICARIA Exam: edematous pink papules and plaques +/- dermatographism  Flared  Urticaria or hives is a pink to red patchy whelp- like rash of the skin that typically itches and it is the result of histamine release in the skin.   Hives may have multiple causes including stress, medications, infections, and systemic illness.  Sometimes there is a family history of chronic urticaria.   "Physical urticarias" may be caused by pressure (dermatographism), heat, sun, cold, vibration.  Insect bites can cause "papular urticaria". It is often difficult to find the cause of generalized hives.  Statistically, 70% of the time a cause of generalized hives is not found.  Sometimes hives can spontaneously resolve. Other times hives can persist and when it does, and no cause is found, and it has been at least 6 weeks since started, it is called "chronic idiopathic urticaria". Antihistamines are the mainstay for treatment.  In severe cases Xolair injections may be used.   Treatment Plan: - We will send in Hydrocortisone 2.5% to apply to area for 2 weeks then stop   No follow-ups on file.  Documentation: I have reviewed the above documentation for accuracy and completeness, and I agree with the above.  Stasia Cavalier, am acting as scribe for Langston Reusing, DO.  Langston Reusing, DO

## 2023-03-10 ENCOUNTER — Encounter: Payer: Self-pay | Admitting: Dermatology

## 2023-03-27 ENCOUNTER — Emergency Department (HOSPITAL_COMMUNITY)
Admission: EM | Admit: 2023-03-27 | Discharge: 2023-03-27 | Disposition: A | Discharge status: Home / Self Care | Payer: Medicaid Other | Attending: Emergency Medicine | Admitting: Emergency Medicine

## 2023-03-27 ENCOUNTER — Other Ambulatory Visit: Payer: Self-pay

## 2023-03-27 ENCOUNTER — Encounter (HOSPITAL_COMMUNITY): Payer: Self-pay

## 2023-03-27 DIAGNOSIS — Y9331 Activity, mountain climbing, rock climbing and wall climbing: Secondary | ICD-10-CM | POA: Diagnosis not present

## 2023-03-27 DIAGNOSIS — X509XXA Other and unspecified overexertion or strenuous movements or postures, initial encounter: Secondary | ICD-10-CM | POA: Insufficient documentation

## 2023-03-27 DIAGNOSIS — S39012A Strain of muscle, fascia and tendon of lower back, initial encounter: Secondary | ICD-10-CM | POA: Diagnosis not present

## 2023-03-27 DIAGNOSIS — Y92219 Unspecified school as the place of occurrence of the external cause: Secondary | ICD-10-CM | POA: Insufficient documentation

## 2023-03-27 DIAGNOSIS — M545 Low back pain, unspecified: Secondary | ICD-10-CM | POA: Diagnosis present

## 2023-03-27 LAB — URINALYSIS, ROUTINE W REFLEX MICROSCOPIC
Bacteria, UA: NONE SEEN
Bilirubin Urine: NEGATIVE
Glucose, UA: 50 mg/dL — AB
Hgb urine dipstick: NEGATIVE
Ketones, ur: 5 mg/dL — AB
Nitrite: NEGATIVE
Protein, ur: NEGATIVE mg/dL
Specific Gravity, Urine: 1.021 (ref 1.005–1.030)
pH: 8 (ref 5.0–8.0)

## 2023-03-27 MED ORDER — KETOROLAC TROMETHAMINE 15 MG/ML IJ SOLN
15.0000 mg | Freq: Once | INTRAMUSCULAR | Status: AC
  Administered 2023-03-27: 15 mg via INTRAMUSCULAR
  Filled 2023-03-27: qty 1

## 2023-03-27 MED ORDER — ONDANSETRON 4 MG PO TBDP: 4.0000 mg | ORAL_TABLET | Freq: Three times a day (TID) | ORAL | 0 refills | Status: DC | PRN

## 2023-03-27 MED ORDER — ONDANSETRON HCL 4 MG PO TABS
4.0000 mg | ORAL_TABLET | Freq: Once | ORAL | Status: AC
  Administered 2023-03-27: 4 mg via ORAL
  Filled 2023-03-27: qty 1

## 2023-03-27 MED ORDER — KETOROLAC TROMETHAMINE 15 MG/ML IJ SOLN: 15.0000 mg | Freq: Once | INTRAMUSCULAR | Status: DC

## 2023-03-27 NOTE — ED Provider Triage Note (Signed)
Emergency Medicine Provider Triage Evaluation Note  Sherry Kim , a 22 y.o. female  was evaluated in triage.  Pt complains of lower back pain that radiates to both legs in the last 3 days.  Patient took Tylenol at home which helped initially but the pain then will return.  She denies any urinary symptoms, saddle anesthesia, distal weakness.  No history of kidney stones.  No blood in her urine.  She denies heavy lifting at work.  She states she has been working out but does not do any heavy lifting.  Review of Systems  Positive: As above Negative: As above  Physical Exam  BP 120/81   Pulse 84   Temp 97.6 F (36.4 C) (Oral)   Resp 20   LMP 03/16/2023 (Approximate)   SpO2 100%  Gen:   Awake, no distress   Resp:  Normal effort  MSK:   Moves extremities without difficulty  Other:    Medical Decision Making  Medically screening exam initiated at 2:31 PM.  Appropriate orders placed.  Sherry Kim was informed that the remainder of the evaluation will be completed by another provider, this initial triage assessment does not replace that evaluation, and the importance of remaining in the ED until their evaluation is complete.    Sherry Kim, [place] 03/27/23 1433

## 2023-03-27 NOTE — Discharge Instructions (Addendum)
Thank you for coming to [facility] Emergency Department. You were seen for lower back pain. We did an exam, labs, and these showed most likely a lower back strain. You can alternate taking Tylenol and ibuprofen as needed for pain. You can take 650mg  tylenol (acetaminophen) every 4-6 hours, and 600 mg ibuprofen 3 times a day. You can take zofran under the tongue every 6-8 hours as needed for nausea. You can also utilize ice/heat, massage, and stretching. Please refrain from heavy lifting or working out for a few days to allow your back a chance to heal.  Please follow up with your primary care provider within 1-2 weeks if your symptoms do not improve.  Return to the ED if you develop any of the following: - Fever (100.4 F or 38 C) or chills at home - Weakness, numbness, or tingling in your extremities - Difficulty emptying bladder / urinary incontinence - Fecal incontinence - Uncontrolled nausea/vomiting with inability to keep down liquids - Feeling as though you are going to pass out or passing out - Abnormal vaginal bleeding or abnormal discharge - Abdominal pain - Anything else that concerns you

## 2023-03-27 NOTE — ED Provider Notes (Signed)
[place] EMERGENCY DEPARTMENT AT [facility] Provider Note   CSN: [id number] Arrival date & time: 03/27/23  1347     History  Chief Complaint  Patient presents with   Back Pain    Sherry Kim is a 22 y.o. female with PMH as listed below who presents with lower back pain in the last 3 days.  Patient took Tylenol at home which helped initially but the pain then will return when the tylenol wears off. She has had trouble sleeping at night because of the pain. No h/o similar pain. Located in lower back/sacral area across both sides. Radiates into her hips but not into her legs. She denies any urinary symptoms, saddle anesthesia, distal weakness.  No history of kidney stones.  No urinary or vaginal symptoms, including purulent discharge, vaginal bleeding itching or burning.  She is only sexually active with women.  She has never been pregnant.. She states she has been working out but does not do any heavy lifting.  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  Patient also stated she had x-ray imaging performed at urgent care this morning which did not demonstrate any abnormal findings.   History reviewed. No pertinent past medical history.     Home Medications Prior to Admission medications   Medication Sig Start Date End Date Taking? Authorizing Provider  ondansetron (ZOFRAN-ODT) 4 MG disintegrating tablet Take 1 tablet (4 mg total) by mouth every 8 (eight) hours as needed for nausea or vomiting. 03/27/23  Yes Loetta Rough, MD  b complex vitamins capsule Take 1 capsule by mouth daily.    [provider]  LEVOTHYROXINE SODIUM PO Take by mouth.    [provider]  liothyronine (CYTOMEL) 5 MCG tablet Take by mouth. Patient not taking: Reported on 03/07/2023 11/25/22   [provider]      Allergies    Patient has no known allergies.    Review of Systems   Review of Systems A 10 point review of systems was performed  and is negative unless otherwise reported in HPI.  Physical Exam Updated Vital Signs BP 120/81   Pulse 84   Temp 97.6 F (36.4 C) (Oral)   Resp 20   LMP 03/16/2023 (Approximate)   SpO2 100%  Physical Exam General: Normal appearing female, lying in bed.  HEENT: Sclera anicteric, MMM, trachea midline.  Cardiology: RRR, no murmurs/rubs/gallops. BL radial and DP pulses equal bilaterally.  Resp: Normal respiratory rate and effort. CTAB, no wheezes, rhonchi, crackles.  Abd: Soft, non-tender, non-distended. No rebound tenderness or guarding.  GU: Deferred. MSK: No peripheral edema or signs of trauma.  Skin: warm, dry.  Back: No CVA tenderness. No midline T or L spine TTP, no midline TTP over sacrum. No significant TTP throughout lower back. No gross abnormalities. No signs of trauma.  Neuro: A&Ox4, CNs II-XII grossly intact. MAEs. Sensation grossly intact.  Psych: Normal mood and affect.   ED Results / Procedures / Treatments   Labs (all labs ordered are listed, but only abnormal results are displayed) Labs Reviewed  URINALYSIS, ROUTINE W REFLEX MICROSCOPIC - Abnormal; Notable for the following components:      Result Value   Glucose, UA 50 (*)    Ketones, ur 5 (*)    Leukocytes,Ua TRACE (*)    All other components within normal limits  PREGNANCY, URINE    EKG None  Radiology No results found.  Procedures Procedures  Medications Ordered in ED Medications  ondansetron (ZOFRAN) tablet 4 mg (4 mg Oral Given 03/27/23 1714)  ketorolac (TORADOL) 15 MG/ML injection 15 mg (15 mg Intramuscular Given 03/27/23 1713)    ED Course/ Medical Decision Making/ A&P                          Medical Decision Making Amount and/or Complexity of Data Reviewed Labs: ordered. Decision-making details documented in ED Course.  Risk Prescription drug management.   MDM:    DDX for low back pain includes but is not limited to:   Consider MSK pain as presenting etiology and back strain or  sciatica. Less likely sciatica as straight leg raise test was negative. No back pain red flags on history or physical. Presentation not consistent with malignancy (lack of history of malignancy, lack of B symptoms), fracture (no trauma, no bony tenderness to palpation), cauda equina (no bowel or urinary incontinence/retention, no saddle anesthesia, no distal weakness), osteomyelitis or epidural abscess (no IVDU, vertebral tenderness), renal colic, pyelonephritis (afebrile, no CVAT, no urinary symptoms, negative UA).  Given the patient described her pain in her pelvis as well I also considered other gynecologic causes of her pain but think there lower likelihood, such as PUD (no vaginal symptoms, no abdominal tenderness to palpation, no fever), ovarian cyst or torsion (no unilateral pelvic pain, not intermittent), uterine fibroids or pregnancy (no vaginal bleeding, patient denies pregnancy test as she is only sexually active with women).  Given the clinical picture, no indication for imaging at this time.   Clinical Course as of 03/27/23 1753  Wynelle Link Mar 27, 2023  1620 Urinalysis, Routine w reflex microscopic -Urine, Clean Catch(!) No UTI, no hematuria [HN]  1726 Patient states she is not pregnant and only has sex with women, does not need a pregnancy test. Toradol ordered. [HN]  1751 Patient reevaluated. Feels improvd after zofran/toradol. Advised to alternate tylenol/ibuprofen at home, rest, ice, massage, stretch for likely low back strain. Given specific DC instructions/return precautions. All questions answered to patient's satisfaction. [HN]    Clinical Course User Index [HN] Loetta Rough, MD    Labs: I Ordered, and personally interpreted labs.  The pertinent results include:  no UTI on UA  Additional history obtained from chart review, friend at bedside.    Reevaluation: After the interventions noted above, I reevaluated the patient and found that they have :improved  Social Determinants  of Health: Lives independently  Disposition:  DC w/ discharge instructions/return precautions. All questions answered to patient's satisfaction.    Co morbidities that complicate the patient evaluation History reviewed. No pertinent past medical history.   Medicines Meds ordered this encounter  Medications   DISCONTD: ketorolac (TORADOL) 15 MG/ML injection 15 mg   ondansetron (ZOFRAN) tablet 4 mg   ketorolac (TORADOL) 15 MG/ML injection 15 mg   ondansetron (ZOFRAN-ODT) 4 MG disintegrating tablet    Sig: Take 1 tablet (4 mg total) by mouth every 8 (eight) hours as needed for nausea or vomiting.    Dispense:  20 tablet    Refill:  0    I have reviewed the patients home medicines and have made adjustments as needed  Problem List / ED Course: Problem List Items Addressed This Visit   None Visit Diagnoses     Strain of lumbar region, initial encounter    -  Primary  This note was created using dictation software, which may contain spelling or grammatical errors.    Loetta Rough, MD 03/27/23 [phone]

## 2023-03-27 NOTE — ED Triage Notes (Signed)
Pt reports bilateral lower back pain that started on Friday, the pain now radiates into her pelvis, also causing some nausea. Pt tearful in triage.

## 2023-04-06 ENCOUNTER — Encounter: Payer: Self-pay | Admitting: Emergency Medicine

## 2023-04-06 NOTE — Telephone Encounter (Signed)
No

## 2023-09-06 ENCOUNTER — Encounter (HOSPITAL_COMMUNITY): Payer: Self-pay

## 2023-09-06 ENCOUNTER — Other Ambulatory Visit: Payer: Self-pay

## 2023-09-06 ENCOUNTER — Emergency Department (HOSPITAL_COMMUNITY): Payer: Medicaid Other

## 2023-09-06 ENCOUNTER — Emergency Department (HOSPITAL_COMMUNITY)
Admission: EM | Admit: 2023-09-06 | Discharge: 2023-09-07 | Disposition: A | Discharge status: Home / Self Care | Payer: Medicaid Other | Attending: Emergency Medicine | Admitting: Emergency Medicine

## 2023-09-06 DIAGNOSIS — S93402A Sprain of unspecified ligament of left ankle, initial encounter: Secondary | ICD-10-CM | POA: Diagnosis not present

## 2023-09-06 DIAGNOSIS — X509XXA Other and unspecified overexertion or strenuous movements or postures, initial encounter: Secondary | ICD-10-CM | POA: Insufficient documentation

## 2023-09-06 DIAGNOSIS — M25572 Pain in left ankle and joints of left foot: Secondary | ICD-10-CM | POA: Diagnosis present

## 2023-09-06 DIAGNOSIS — M25472 Effusion, left ankle: Secondary | ICD-10-CM

## 2023-09-06 NOTE — ED Triage Notes (Signed)
Pt to ED c/o left ankle pain that started 5 days ago, progressively getting worse and more swollen. No known injury. Ambulatory in triage

## 2023-09-07 ENCOUNTER — Emergency Department (HOSPITAL_BASED_OUTPATIENT_CLINIC_OR_DEPARTMENT_OTHER): Payer: Medicaid Other

## 2023-09-07 DIAGNOSIS — M7989 Other specified soft tissue disorders: Secondary | ICD-10-CM

## 2023-09-07 MED ORDER — ACETAMINOPHEN 500 MG PO TABS
1000.0000 mg | ORAL_TABLET | Freq: Once | ORAL | Status: AC
  Administered 2023-09-07: 1000 mg via ORAL
  Filled 2023-09-07: qty 2

## 2023-09-07 MED ORDER — IBUPROFEN 400 MG PO TABS
600.0000 mg | ORAL_TABLET | Freq: Once | ORAL | Status: AC
  Administered 2023-09-07: 600 mg via ORAL
  Filled 2023-09-07: qty 1

## 2023-09-07 NOTE — Discharge Instructions (Addendum)
Your ultrasound did not show a blood clot. Follow-up with sports medicine and rheumatology for further evaluation. Use Tylenol every 4 hours and ibuprofen every 6 hours as needed for pain.  For severe pain you can take them both together every 6 hours and also use an ice pack.  Work/school note given.  Crutches as needed for support.

## 2023-09-07 NOTE — Progress Notes (Signed)
LLE venous duplex has been completed.  Prelimianry results given to Dr. Jodi Mourning.   Results can be found under chart review under CV PROC. 09/07/2023 9:41 AM Britney Captain RVT, RDMS

## 2023-09-07 NOTE — ED Provider Notes (Signed)
[place] EMERGENCY DEPARTMENT AT [facility] Provider Note   CSN: [id number] Arrival date & time: 09/06/23  2052     History  Chief Complaint  Patient presents with   Ankle Pain    left    Sherry Kim is a 23 y.o. female.  Patient presents with left ankle pain that started presently 5 days ago without injury.  Patient was walking on it all day long but no direct trauma or rolling.  Patient has had history of joint swelling the past and was worked up for lupus and saw a rheumatologist.  No formal diagnosis made.  Currently no other joints involved.  Mild leg swelling.  No blood clot history no estrogen use no prolonged travel or recent surgeries.  No shortness of breath or fevers.  Pain with walking getting worse on the left ankle.  The history is provided by the patient.  Ankle Pain Associated symptoms: no back pain, no fever and no neck pain        Home Medications Prior to Admission medications   Medication Sig Start Date End Date Taking? Authorizing Provider  b complex vitamins capsule Take 1 capsule by mouth daily.    [provider]  LEVOTHYROXINE SODIUM PO Take by mouth.    [provider]  liothyronine (CYTOMEL) 5 MCG tablet Take by mouth. Patient not taking: Reported on 03/07/2023 11/25/22   [provider]  ondansetron (ZOFRAN-ODT) 4 MG disintegrating tablet Take 1 tablet (4 mg total) by mouth every 8 (eight) hours as needed for nausea or vomiting. 03/27/23   Loetta Rough, MD      Allergies    Patient has no known allergies.    Review of Systems   Review of Systems  Constitutional:  Negative for chills and fever.  HENT:  Negative for congestion.   Eyes:  Negative for visual disturbance.  Respiratory:  Negative for shortness of breath.   Cardiovascular:  Negative for chest pain.  Gastrointestinal:  Negative for abdominal pain and vomiting.  Genitourinary:  Negative for dysuria and flank pain.  Musculoskeletal:   Positive for gait problem and joint swelling. Negative for back pain, neck pain and neck stiffness.  Skin:  Negative for rash.  Neurological:  Negative for light-headedness and headaches.    Physical Exam Updated Vital Signs BP 124/81 (BP Location: Left Arm)   Pulse 70   Temp 98.3 F (36.8 C) (Oral)   Resp 16   Ht 5\' 2"  (1.575 m)   Wt 73.9 kg   LMP 09/05/2023   SpO2 100%   BMI 29.81 kg/m  Physical Exam Vitals and nursing note reviewed.  Constitutional:      General: She is not in acute distress.    Appearance: She is well-developed.  HENT:     Head: Normocephalic and atraumatic.     Mouth/Throat:     Mouth: Mucous membranes are moist.  Eyes:     General:        Right eye: No discharge.        Left eye: No discharge.     Conjunctiva/sclera: Conjunctivae normal.  Neck:     Trachea: No tracheal deviation.  Cardiovascular:     Rate and Rhythm: Normal rate.  Pulmonary:     Effort: Pulmonary effort is normal.  Abdominal:     General: There is no distension.     Palpations: Abdomen is soft.     Tenderness: There is no abdominal tenderness. There is no  guarding.  Musculoskeletal:        General: Swelling and tenderness present. No deformity.     Cervical back: Normal range of motion and neck supple. No rigidity.     Comments: Patient has mild edema left ankle pain with flexion and eversion.  Neurovascular intact.  No mid or distal foot tenderness.  No plantar tenderness.  No proximal tibia or fibula tenderness.    Skin:    General: Skin is warm.     Capillary Refill: Capillary refill takes less than 2 seconds.     Findings: No rash.  Neurological:     General: No focal deficit present.     Mental Status: She is alert.     Cranial Nerves: No cranial nerve deficit.  Psychiatric:        Mood and Affect: Mood normal.     ED Results / Procedures / Treatments   Labs (all labs ordered are listed, but only abnormal results are displayed) Labs Reviewed - No data to  display  EKG None  Radiology VAS [place] LOWER EXTREMITY VENOUS (DVT) (7a-7p) Result Date: 09/07/2023  Lower Venous DVT Study Patient Name:  Sherry Kim  Date of Exam:   09/07/2023 Medical Rec #: [id number]            Accession #:    [id number] Date of Birth: May 03, 2001             Patient Gender: F Patient Age:   22 years Exam Location:  [facility] Procedure:      VAS [place] LOWER EXTREMITY VENOUS (DVT) Referring Phys: Ivin Booty Maeve Debord --------------------------------------------------------------------------------  Indications: Left ankle pain and swelling x 5 days.  Comparison Study: No previous exams Performing Technologist: Jody Hill RVT, RDMS  Examination Guidelines: A complete evaluation includes B-mode imaging, spectral Doppler, color Doppler, and power Doppler as needed of all accessible portions of each vessel. Bilateral testing is considered an integral part of a complete examination. Limited examinations for reoccurring indications may be performed as noted. The reflux portion of the exam is performed with the patient in reverse Trendelenburg.  +-----+---------------+---------+-----------+----------+--------------+ RIGHTCompressibilityPhasicitySpontaneityPropertiesThrombus Aging +-----+---------------+---------+-----------+----------+--------------+ CFV  Full           Yes      Yes                                 +-----+---------------+---------+-----------+----------+--------------+   +---------+---------------+---------+-----------+----------+--------------+ LEFT     CompressibilityPhasicitySpontaneityPropertiesThrombus Aging +---------+---------------+---------+-----------+----------+--------------+ CFV      Full           Yes      Yes                                 +---------+---------------+---------+-----------+----------+--------------+ SFJ      Full                                                         +---------+---------------+---------+-----------+----------+--------------+ FV Prox  Full           Yes      Yes                                 +---------+---------------+---------+-----------+----------+--------------+  FV Mid   Full           Yes      Yes                                 +---------+---------------+---------+-----------+----------+--------------+ FV DistalFull           Yes      Yes                                 +---------+---------------+---------+-----------+----------+--------------+ PFV      Full                                                        +---------+---------------+---------+-----------+----------+--------------+ POP      Full           Yes      Yes                                 +---------+---------------+---------+-----------+----------+--------------+ PTV      Full                                                        +---------+---------------+---------+-----------+----------+--------------+ PERO     Full                                                        +---------+---------------+---------+-----------+----------+--------------+     Summary: RIGHT: - No evidence of common femoral vein obstruction.   LEFT: - There is no evidence of deep vein thrombosis in the lower extremity.  - No cystic structure found in the popliteal fossa.  *See table(s) above for measurements and observations.    Preliminary    DG Ankle Complete Left Result Date: 09/06/2023 CLINICAL DATA:  swelling Pain, no known injury. EXAM: LEFT ANKLE COMPLETE - 3+ VIEW COMPARISON:  None Available. FINDINGS: Normal alignment. Ankle mortise is preserved. No fracture. No erosion or focal bone abnormality. There is an ankle joint effusion. Medial soft tissue edema. IMPRESSION: Medial soft tissue edema and ankle joint effusion. No acute osseous findings of the left ankle. Electronically Signed   By: Narda Rutherford M.D.   On: 09/06/2023 22:02     Procedures Procedures    Medications Ordered in ED Medications  acetaminophen (TYLENOL) tablet 1,000 mg (1,000 mg Oral Given 09/07/23 0840)  ibuprofen (ADVIL) tablet 600 mg (600 mg Oral Given 09/07/23 0840)    ED Course/ Medical Decision Making/ A&P                                 Medical Decision Making Amount and/or Complexity of Data Reviewed Radiology: ordered.  Risk OTC drugs.   Patient presents with primarily left ankle edema and discomfort likely sprain or overuse.  Other  differentials include stress fracture and less likely blood clot/DVT with no risk factors.  Discussed continued follow-up with rheumatology in case this is related to a systemic illness such as lupus that she has been worked up in the past.  Patient has no fever or signs of septic joint.  X-ray reviewed independently no acute fracture mild edema seen.  Tylenol ibuprofen ordered.  Crutches for support follow-up with sports medicine patient has appointment dermatology.  Ultrasound to look for blood clot and then patient be safe for discharge. Ultrasound results independently reviewed no blood clot.  Crutches and Ace wrap given.         Final Clinical Impression(s) / ED Diagnoses Final diagnoses:  Edema of left ankle  Moderate left ankle sprain, initial encounter    Rx / DC Orders ED Discharge Orders     None         Blane Ohara, MD 09/07/23 1013

## 2023-09-07 NOTE — Progress Notes (Signed)
Orthopedic Tech Progress Note Patient Details:  Sherry Kim 07/22/2000 [id number]  Ortho Devices Type of Ortho Device: Crutches, Ace wrap Ortho Device/Splint Location: Left ankle Ortho Device/Splint Interventions: Application   Post Interventions Patient Tolerated: Well, Ambulated well  Sherry Kim 09/07/2023, 8:29 AM

## 2023-10-03 NOTE — Progress Notes (Unsigned)
 History:  [name] is a 23 y.o. No obstetric history on file. who presents to clinic today for annual well woman exam with first pap.  She reports a weight gain of 45 pounds in one year several years ago at which time she was worked up for thyroid disorders and started on thyroid medication. She reports that she experienced insomnia and agitation with this medication. She is seeing a nutritionist for weight loss. She is additionally followed by rheumatology and dermatology.   Reports poor response to OCP in teen years, from [age] to regulate cycle.   Endorses maternal history of uterine neoplasia at 31 years. Denies family history of abnormal or heavy bleeding to her knowledge.  She reports that some cycles are longer than average - approximately 60 days by her recollection. She reports irregular spotting and heavy cycles. Post-coital bleeding noted - points to moderate bleeding on poster in room.   The following portions of the patient's history were reviewed and updated as appropriate: allergies, current medications, family history, past medical history, social history, past surgical history and problem list.  Review of Systems:  Review of Systems  Constitutional: Negative.   Neurological:  Positive for headaches.  Psychiatric/Behavioral: Negative.        Objective:  Physical Exam BP 124/79 (BP Location: Right Arm, Patient Position: Sitting, Cuff Size: Normal)   Pulse 76   Ht 5\' 2"  (1.575 m)   Wt 163 lb (73.9 kg)   LMP 09/05/2023   SpO2 97%   BMI 29.81 kg/m  Physical Exam Exam conducted with a chaperone present.  Constitutional:      Appearance: Normal appearance. She is normal weight.  HENT:     Head: Normocephalic.  Cardiovascular:     Rate and Rhythm: Normal rate and regular rhythm.     Pulses: Normal pulses.  Pulmonary:     Effort: Pulmonary effort is normal.  Genitourinary:    General: Normal vulva.     Exam position: Lithotomy position.     Vagina:  Normal.     Cervix: Friability, cervical bleeding and eversion present. No cervical motion tenderness.     Comments: Speculum used to visualize cervix and obtain pap specimen. Cervix with ectropion os noted, broom used to obtain pap specimen with cotesting. Friable tissue noted with bleeding following specimen collection. Patient tolerated well.  Musculoskeletal:        General: Normal range of motion.  Skin:    General: Skin is warm and dry.     Capillary Refill: Capillary refill takes less than 2 seconds.  Neurological:     Mental Status: She is alert and oriented to person, place, and time.  Psychiatric:        Mood and Affect: Mood normal.        Behavior: Behavior normal.        Thought Content: Thought content normal.        Judgment: Judgment normal.      Labs and Imaging No results found for this or any previous visit (from the past 24 hours).  No results found.   Assessment & Plan:  1. Annual physical exam (Primary) - PE WNL other than friable cervix with ectropion changes noted.   2. Well woman exam - Patient reports irregular menstrual cycle, reports that this has been going on for the past year. Counseled to keep a record of cycle to monitor changes and pattern.  - Declines hormonal management. - Thyroid panel and CBC ordered today.  3. Cervical cancer screening - Pap collected.  4. STI screening - Patient requests screening testing with pap collection.   5. Abnormal uterine bleeding - Declines hormonal management at this time. - Counseled to keep track of cycles by method of patient preference.  - Lab work ordered today.  Richardson Landry, CNM 10/04/2023 12:26 PM

## 2023-10-04 ENCOUNTER — Other Ambulatory Visit (HOSPITAL_COMMUNITY)
Admission: RE | Admit: 2023-10-04 | Discharge: 2023-10-04 | Disposition: A | Discharge status: Home / Self Care | Source: Ambulatory Visit | Attending: Certified Nurse Midwife | Admitting: Certified Nurse Midwife

## 2023-10-04 ENCOUNTER — Other Ambulatory Visit: Payer: Self-pay

## 2023-10-04 ENCOUNTER — Ambulatory Visit (INDEPENDENT_AMBULATORY_CARE_PROVIDER_SITE_OTHER): Payer: Medicaid Other | Admitting: Certified Nurse Midwife

## 2023-10-04 VITALS — BP 124/79 | HR 76 | Ht 62.0 in | Wt 163.0 lb

## 2023-10-04 DIAGNOSIS — Z124 Encounter for screening for malignant neoplasm of cervix: Secondary | ICD-10-CM

## 2023-10-04 DIAGNOSIS — N939 Abnormal uterine and vaginal bleeding, unspecified: Secondary | ICD-10-CM

## 2023-10-04 DIAGNOSIS — Z0000 Encounter for general adult medical examination without abnormal findings: Secondary | ICD-10-CM | POA: Diagnosis not present

## 2023-10-04 DIAGNOSIS — Z01419 Encounter for gynecological examination (general) (routine) without abnormal findings: Secondary | ICD-10-CM

## 2023-10-04 DIAGNOSIS — Z113 Encounter for screening for infections with a predominantly sexual mode of transmission: Secondary | ICD-10-CM

## 2023-10-04 DIAGNOSIS — Z309 Encounter for contraceptive management, unspecified: Secondary | ICD-10-CM

## 2023-10-05 ENCOUNTER — Encounter: Payer: Self-pay | Admitting: Certified Nurse Midwife

## 2023-10-05 LAB — CYTOLOGY - PAP
Chlamydia: NEGATIVE
Comment: NEGATIVE
Comment: NEGATIVE
Comment: NORMAL
Diagnosis: NEGATIVE
Neisseria Gonorrhea: NEGATIVE
Trichomonas: NEGATIVE

## 2023-10-05 LAB — CBC WITH DIFFERENTIAL/PLATELET
Basophils Absolute: 0 10*3/uL (ref 0.0–0.2)
Basos: 0 %
EOS (ABSOLUTE): 0.2 10*3/uL (ref 0.0–0.4)
Eos: 2 %
Hematocrit: 42.4 % (ref 34.0–46.6)
Hemoglobin: 14 g/dL (ref 11.1–15.9)
Immature Grans (Abs): 0 10*3/uL (ref 0.0–0.1)
Immature Granulocytes: 0 %
Lymphocytes Absolute: 2.5 10*3/uL (ref 0.7–3.1)
Lymphs: 36 %
MCH: 28.8 pg (ref 26.6–33.0)
MCHC: 33 g/dL (ref 31.5–35.7)
MCV: 87 fL (ref 79–97)
Monocytes Absolute: 0.6 10*3/uL (ref 0.1–0.9)
Monocytes: 9 %
Neutrophils Absolute: 3.6 10*3/uL (ref 1.4–7.0)
Neutrophils: 53 %
Platelets: 264 10*3/uL (ref 150–450)
RBC: 4.86 x10E6/uL (ref 3.77–5.28)
RDW: 12.6 % (ref 11.7–15.4)
WBC: 6.9 10*3/uL (ref 3.4–10.8)

## 2023-10-05 LAB — THYROID PANEL WITH TSH
Free Thyroxine Index: 2.5 (ref 1.2–4.9)
T3 Uptake Ratio: 28 % (ref 24–39)
T4, Total: 8.9 ug/dL (ref 4.5–12.0)
TSH: 3.47 u[IU]/mL (ref 0.450–4.500)

## 2023-10-14 NOTE — Progress Notes (Deleted)
 Office Visit Note  Patient: Sherry Kim             Date of Birth: 2001-04-09           MRN: [id number]             PCP: Georgina Quint, MD Referring: Georgina Quint, * Visit Date: 10/27/2023   Subjective:  No chief complaint on file.   History of Present Illness: Sherry Kim is a 23 y.o. female here for follow up with ongoing joint pains worsening over the past 6 months and bilateral hips and bilateral knees.    Previous HPI 08/30/2022 Sherry Kim is a 23 y.o. female here for follow up with ongoing joint pains worsening over the past 6 months and bilateral hips and bilateral knees.  She does not recall any specific events or medical changes.  Symptoms are present daily she tends to notice it more at nighttime or when lunging or descending stairs.  She does not see a lot of visible swelling most of the time but has noticed some at least in the left knee which is difficult to fully bend describes it as feeling gets around in bubble wrap limiting range of motion.  She does get occasional low back pain with radiation.  Erythematous facial rashes have been slightly more prominent still without any focal lesions pain or itching.  No new oral or nasal ulcers.  She notices occasional violaceous coloration in the toes no Raynaud's problems in her hands.   Previous HPI 02/23/22 Sherry Kim is a 23 y.o. female here for follow up for positive ANA with migraines, fatigue, joint pains, and discolorations of fingers and toes.  Overall she has been doing well.  She continues to have mild facial rashes much of the time at baseline.  Still has pallor affecting the toes during cold exposure and she feels like her feet stay cold much of the time.  Still has some joint pains usually increased with heavier use does not see any associated swelling or discoloration. Right 2nd and 3rd MCP joints occasionally tender without swelling or affect on ROM.     Previous  HPI 08/10/2021 Sherry Kim is a 23 y.o. female here for follow up for positive ANA with migraines, fatigue, joint pains, and discolorations of fingers and toes. ANA was very highly positive other specific antibody markers have been negative. Lupus anticoagulant test was negative on confirmatory test. Since our last visit symptoms are about the same she still has headaches ongoing frequently, difficulty getting up and moving in the mornings, rash that feels hot or burning onto her face.   Previous HPI 07/21/21 Sherry Kim is a 23 y.o. female here for positive ANA.  She is having multiple new symptoms since early last year particularly after new headaches from striking her head on a roller coaster in April.  Is unclear whether she experienced a true concussion or just had some exacerbation of underlying headaches probably migrainous.  Before this she was generally well medical history including eczema, migraine, and possible irritable bowels.  Headaches have been the biggest problem but also experiencing worsening of fatigue and increased overall joint pains.  Intermittently symptoms including facial rashes that come and go for days at a time, pain and mild swelling under the armpits currently on the right side, spontaneous bruising on her thighs, and purple discoloration of toes. She has been hesitant to start prescription migraine medications still concerned about underlying causes  with a normal brain MRI in November.  She has associated photophobia.  Lab work-up for this in neurology clinic demonstrated the highly positive ANA antibodies.  She most often uses ibuprofen and sleeps with resolution of headache. The facial rashes come and go no association with sun exposure.  She does not experience brief flushing here or in other locations with heat or humidity.  She had eczema rashes more in the extremities but had improved before now.  She denies any noticeable alopecia.  No oral or nasal ulcers  except when biting her cheek.  Painful swelling under her armpits without any palpable nodules.  Purple discoloration in her toes when cold this improves after rewarming.  She does not see any pallor and there is no involvement of her fingers.  Spontaneous bruises have been mostly in the upper thigh without significant pain or itching sensation.  She has no history of abnormal bleeding or blood clots and has never been pregnant.   Labs reviewed 05/2021 ANA 1:2560 homogenous 1:2560 speckled dsDNA 34 SM, RNP, Ro, La neg RF neg ESR 11 TSH 6.27 Free T4 1.02 UA wnl   No Rheumatology ROS completed.   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  Family History  Problem Relation Age of Onset   Migraines Mother    Ankylosing spondylitis Father    Squamous cell carcinoma Father    No past surgical history on file. Social History   Social History Narrative   Not on file   Immunization History  Administered Date(s) Administered   HPV 9-valent 04/03/2014, 06/03/2014   Meningococcal Conjugate 04/03/2014     Objective: Vital Signs: There were no vitals taken for this visit.   Physical Exam   Musculoskeletal Exam: ***  CDAI Exam: CDAI Score: -- Patient Global: --; Provider Global: -- Swollen: --; Tender: -- Joint Exam 10/27/2023   No joint exam has been documented for this visit   There is currently no information documented on the homunculus. Go to the Rheumatology activity and complete the homunculus joint exam.  Investigation: No additional findings.  Imaging: No results found.  Recent Labs: Lab  Results  Component Value Date   WBC 6.9 10/04/2023   HGB 14.0 10/04/2023   PLT 264 10/04/2023    Speciality Comments: No specialty comments available.  Procedures:  No procedures performed Allergies: Patient has no known allergies.   Assessment / Plan:     Visit Diagnoses: No diagnosis found.  ***  Orders: No orders of the defined types were placed in this encounter.  No orders of the defined types were placed in this encounter.    Follow-Up Instructions: No follow-ups on file.   Metta Clines, RT  Note - This record has been created using [employer].  Chart creation errors have been sought, but may not always  have been located. Such creation errors do not reflect on  the standard of medical care.

## 2023-10-27 ENCOUNTER — Ambulatory Visit: Payer: Medicaid Other | Admitting: Internal Medicine

## 2023-10-27 DIAGNOSIS — R21 Rash and other nonspecific skin eruption: Secondary | ICD-10-CM

## 2023-10-27 DIAGNOSIS — M533 Sacrococcygeal disorders, not elsewhere classified: Secondary | ICD-10-CM

## 2023-10-27 DIAGNOSIS — M357 Hypermobility syndrome: Secondary | ICD-10-CM

## 2023-10-27 DIAGNOSIS — R768 Other specified abnormal immunological findings in serum: Secondary | ICD-10-CM

## 2023-11-24 ENCOUNTER — Ambulatory Visit: Admitting: Emergency Medicine

## 2023-11-24 VITALS — BP 118/70 | HR 115 | Temp 99.1°F | Ht 62.0 in | Wt 164.0 lb

## 2023-11-24 DIAGNOSIS — I8393 Asymptomatic varicose veins of bilateral lower extremities: Secondary | ICD-10-CM | POA: Diagnosis not present

## 2023-11-24 DIAGNOSIS — J988 Other specified respiratory disorders: Secondary | ICD-10-CM

## 2023-11-24 DIAGNOSIS — G43909 Migraine, unspecified, not intractable, without status migrainosus: Secondary | ICD-10-CM | POA: Insufficient documentation

## 2023-11-24 DIAGNOSIS — B9789 Other viral agents as the cause of diseases classified elsewhere: Secondary | ICD-10-CM

## 2023-11-24 DIAGNOSIS — G43809 Other migraine, not intractable, without status migrainosus: Secondary | ICD-10-CM | POA: Diagnosis not present

## 2023-11-24 NOTE — Assessment & Plan Note (Addendum)
 Symptoms just started.  Early in the course. Advised to monitor symptoms and contact the office if no better or worse during the next several days Symptom management discussed

## 2023-11-24 NOTE — Progress Notes (Signed)
 Sherry Kim 23 y.o.   Chief Complaint  Patient presents with   Varicose Veins    Patient here for to talk about her varicose vein she states it does run in her family, want to know if there is anything that can help with the veins visibly since she is still young. Also mentions her mom has issues with her legs aching because of a vein that has been clipped, pt is experiencing leg aches from the knee down in the mornings. Also mentions she has a chest congestion that started last night after her migraine attack.   HISTORY OF PRESENT ILLNESS: This is a 23 y.o. female concerned about varicose veins. Family history of varicose veins Also complaining of chest congestion that started last night Recently had bad migraine headache No other complaints or medical concerns today.  HPI   Prior to Admission medications   Medication Sig Start Date End Date Taking? Authorizing Provider  b complex vitamins capsule Take 1 capsule by mouth daily. Patient not taking: Reported on 10/04/2023    [provider]  LEVOTHYROXINE SODIUM PO Take by mouth. Patient not taking: Reported on 10/04/2023    [provider]  liothyronine (CYTOMEL) 5 MCG tablet Take by mouth. Patient not taking: Reported on 10/04/2023 11/25/22   [provider]  ondansetron  (ZOFRAN -ODT) 4 MG disintegrating tablet Take 1 tablet (4 mg total) by mouth every 8 (eight) hours as needed for nausea or vomiting. 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  No  past surgical history on file.  Social History   Socioeconomic History   Marital status: Single    Spouse name: Not on file   Number of children: Not on file   Years of education: Not on file   Highest education level: Some college, no degree  Occupational History   Not on file  Tobacco Use   Smoking status: Never    Passive exposure: Never   Smokeless tobacco: Never  Vaping Use   Vaping status: Never Used  Substance and Sexual Activity   Alcohol use: Never   Drug use: Never   Sexual activity: Not on file  Other Topics Concern   Not on file  Social History Narrative   Not on file   Social Drivers of Health   Financial Resource Strain: Medium Risk (11/24/2023)   Overall Financial Resource Strain (CARDIA)    Difficulty of Paying Living Expenses: Somewhat hard  Food Insecurity: No Food Insecurity (11/24/2023)   Hunger Vital Sign    Worried About Running Out of Food in the Last Year: Never true    Ran Out of Food in the Last Year: Never true  Transportation Needs: No Transportation Needs (11/24/2023)   PRAPARE - Administrator, Civil Service (Medical): No    Lack of Transportation (Non-Medical): No  Physical Activity: Sufficiently Active (11/24/2023)   Exercise Vital Sign    Days of Exercise per Week: 5 days    Minutes of Exercise per Session: 30 min  Stress: Stress Concern  Present (11/24/2023)   [employer] of Occupational Health - Occupational Stress Questionnaire    Feeling of Stress : Rather much  Social Connections: Socially Isolated (11/24/2023)   Social Connection and Isolation Panel [NHANES]    Frequency of Communication with Friends and Family: More than three times a week    Frequency of Social Gatherings with Friends and Family: Once a week    Attends Religious Services: Never    Database administrator or Organizations: No    Attends Engineer, structural: Not on file    Marital Status: Never married  Catering manager Violence: Not on file     Family History  Problem Relation Age of Onset   Migraines Mother    Ankylosing spondylitis Father    Squamous cell carcinoma Father      Review of Systems  Constitutional: Negative.  Negative for chills and fever.  HENT:  Positive for congestion.   Respiratory: Negative.  Negative for cough and shortness of breath.   Cardiovascular: Negative.  Negative for chest pain and palpitations.  Gastrointestinal:  Negative for abdominal pain, diarrhea, nausea and vomiting.  Genitourinary: Negative.  Negative for dysuria and hematuria.  Skin: Negative.  Negative for rash.  Neurological:  Positive for headaches.  All other systems reviewed and are negative.   Vitals:   11/24/23 1010  BP: 118/70  Pulse: (!) 115  Temp: 99.1 F (37.3 C)  SpO2: 98%    Physical Exam Vitals reviewed.  Constitutional:      Appearance: Normal appearance.  HENT:     Head: Normocephalic.     Mouth/Throat:     Mouth: Mucous membranes are moist.     Pharynx: Oropharynx is clear.  Eyes:     Extraocular Movements: Extraocular movements intact.     Pupils: Pupils are equal, round, and reactive to light.  Cardiovascular:     Rate and Rhythm: Normal rate and regular rhythm.     Pulses: Normal pulses.     Heart sounds: Normal heart sounds.  Pulmonary:     Effort: Pulmonary effort is normal.     Breath sounds: Normal breath sounds.  Musculoskeletal:     Cervical back: No tenderness.  Lymphadenopathy:     Cervical: No cervical adenopathy.  Skin:    General: Skin is warm and dry.     Capillary Refill: Capillary refill takes less than 2 seconds.     Comments: Lower extremities: Very superficial and flat varicose veins.  No inflammation.  No erythema.  No complications.  Mild case.  Neurological:     General: No focal deficit present.     Mental Status: She is alert and oriented to person, place, and time.  Psychiatric:        Mood and Affect: Mood normal.        Behavior: Behavior normal.       ASSESSMENT & PLAN: A total of 32 minutes was spent with the patient and counseling/coordination of care regarding preparing for this visit, review of most recent office visit notes, review of most recent blood work results, diagnosis and management of varicose veins, diagnosis and management of viral upper respiratory infections, diagnosis and management of migraine headaches, review of all medications, education on nutrition, prognosis, documentation and need for follow-up.  Problem List Items Addressed This Visit       Cardiovascular and Mediastinum   Asymptomatic varicose veins of both lower extremities - Primary   No complications.  No signs of cellulitis or  phlebitis. Diet and nutrition discussed Non-smoker.  Benefits of exercise discussed. No concerns at this time.      Migraine   Severe episode yesterday but gone today Did take Tylenol , drank coffee, and stayed well-hydrated Much improved today Migraine management discussed        Respiratory   Viral respiratory infection   Symptoms just started.  Early in the course. Advised to monitor symptoms and contact the office if no better or worse during the next several days Symptom management discussed       Patient Instructions  Varicose Veins Varicose veins are veins that have become enlarged, bulged, and twisted. They most often appear in the legs. What are the causes? This condition is caused by damage to the valves in the vein. These valves help blood return to your heart. When they are damaged and they stop working properly, blood may flow backward and back up in the veins near the skin, causing the veins to get larger and appear twisted. The condition can result from any issue that causes blood to back up, like pregnancy, prolonged standing, or obesity. What increases the risk? The following factors may make you more likely to develop this condition: Being on your feet a lot. Being pregnant. Being  overweight. Smoking. Having had a previous deep vein thrombosis or having a thrombotic disorder. Aging. The risk increases with age. Having a condition called Klippel-Trenaunay syndrome. What are the signs or symptoms? Symptoms of this condition include: Bulging, twisted, and bluish veins. A feeling of heaviness in your legs. This may be worse at the end of the day. Leg pain. This may be worse at the end of the day. Swelling in the leg. Changes in skin color over the veins. Swelling or pain in the legs can limit your activities. Your symptoms may get worse when you sit or stand for long periods of time. How is this diagnosed? This condition may be diagnosed based on: Your symptoms, family history, activity levels, and lifestyle. A physical exam. You may also have tests, including an ultrasound or X-ray. How is this treated? Treatment for this condition may involve: Avoiding sitting or standing in one position for long periods of time. Wearing compression stockings. These stockings help to prevent blood clots and reduce swelling in the legs. Raising (elevating) the legs when resting. Losing weight. Exercising regularly. If you have persistent symptoms or want to improve the way your varicose veins look, you may choose to have a procedure to close the varicose veins off or to remove them. Nonsurgical treatments to close off the veins include: Sclerotherapy. In this treatment, a solution is injected into a vein to close it off. Laser treatment. The vein is heated with a laser to close it off. Radiofrequency vein ablation. An electrical current produced by radio waves is used to close off the vein. Surgical treatments to remove the veins include: Phlebectomy. In this procedure, the veins are removed through small incisions made over the veins. Vein ligation and stripping. In this procedure, incisions are made over the veins. The veins are then removed after being tied (ligated) with  stitches (sutures). Follow these instructions at home: Medicines Take over-the-counter and prescription medicines only as told by your health care provider. If you were prescribed an antibiotic medicine, use it as told by your health care provider. Do not stop using the antibiotic even if you start to feel better. Activity Walk as much as possible. Walking increases blood flow. This helps blood return to  the heart and takes pressure off your veins. Do not stand or sit in one position for a long period of time. Do not sit with your legs crossed. Avoid sitting for a long time without moving. Get up to take short walks every 1-2 hours. This is important to improve blood flow and breathing. Ask for help if you feel weak or unsteady. Return to your normal activities as told by your health care provider. Ask your health care provider what activities are safe for you. Do exercises as told by your health care provider. General instructions  Follow any diet instructions given to you by your health care provider. Elevate your legs at night to above the level of your heart. If you get a cut in the skin over the varicose vein and the vein bleeds: Lie down with your leg raised. Apply firm pressure to the cut with a clean cloth until the bleeding stops. Place a bandage (dressing) on the cut. Drink enough fluid to keep your urine pale yellow. Do not use any products that contain nicotine or tobacco. These products include cigarettes, chewing tobacco, and vaping devices, such as e-cigarettes. If you need help quitting, ask your health care provider. Wear compression stockings as told by your health care provider. Do not wear other kinds of tight clothing around your legs, pelvis, or waist. Keep all follow-up visits. This is important. Contact a health care provider if: The skin around your varicose veins starts to break down. You have more pain, redness, tenderness, or hard swelling over a vein. You are  uncomfortable because of pain. You get a cut in the skin over a varicose vein and it will not stop bleeding. Get help right away if: You have chest pain. You have trouble breathing. You have severe leg pain. Summary Varicose veins are veins that have become enlarged, bulged, and twisted. They most often appear in the legs. This condition is caused by damage to the valves in the vein. These valves help blood return to your heart. Treatment for this condition includes frequent movements, wearing compression stockings, losing weight, and exercising regularly. In some cases, procedures are done to close off or remove the veins. Nonsurgical treatments to close off the veins include sclerotherapy, laser therapy, and radiofrequency vein ablation. This information is not intended to replace advice given to you by your health care provider. Make sure you discuss any questions you have with your health care provider. Document Revised: 12/17/2020 Document Reviewed: 12/17/2020 Elsevier Patient Education  2024 Elsevier Inc.    Maryagnes Small, MD [facility]

## 2023-11-24 NOTE — Patient Instructions (Signed)

## 2023-11-24 NOTE — Assessment & Plan Note (Signed)
 No complications.  No signs of cellulitis or phlebitis. Diet and nutrition discussed Non-smoker.  Benefits of exercise discussed. No concerns at this time.

## 2023-11-24 NOTE — Assessment & Plan Note (Signed)
 Severe episode yesterday but gone today Did take Tylenol , drank coffee, and stayed well-hydrated Much improved today Migraine management discussed

## 2024-01-17 ENCOUNTER — Ambulatory Visit (INDEPENDENT_AMBULATORY_CARE_PROVIDER_SITE_OTHER): Admitting: Podiatry

## 2024-01-17 ENCOUNTER — Telehealth: Payer: Self-pay | Admitting: Radiology

## 2024-01-17 ENCOUNTER — Other Ambulatory Visit: Payer: Self-pay | Admitting: Radiology

## 2024-01-17 ENCOUNTER — Encounter: Payer: Self-pay | Admitting: Podiatry

## 2024-01-17 DIAGNOSIS — B351 Tinea unguium: Secondary | ICD-10-CM

## 2024-01-17 DIAGNOSIS — B353 Tinea pedis: Secondary | ICD-10-CM

## 2024-01-17 DIAGNOSIS — I8393 Asymptomatic varicose veins of bilateral lower extremities: Secondary | ICD-10-CM

## 2024-01-17 NOTE — Telephone Encounter (Signed)
 hepatic function panel was placed by podiatry

## 2024-01-17 NOTE — Telephone Encounter (Signed)
 LVM for patient to call back regarding her lab appointment.. needing clarification on what labs she is referring to or is needing

## 2024-01-17 NOTE — Progress Notes (Unsigned)
     Chief Complaint  Patient presents with   Nail Problem    Fungus on the left foot second and fourth toe nails. Lazer procedure done in the past. Numbness. Non diabetic.   HPI: 23 y.o. female presents today with concern of fungus on the left 2nd and 4th toenails.  She also notes some dry, flaky skin and itching on the left foot as well.  States that she has tried virtually every topical over-the-counter treatment in the past without success.  She went through 3-4 laser nail treatments with no improvement.  She is open to oral medication at this time.  She did have a history of a positive fungal nail culture in 2022  History reviewed. No pertinent past medical history. History reviewed. No pertinent surgical history. No Known Allergies   Physical Exam: There are palpable pedal pulses.  No open lesions are noted.  There is some thickness and distal onycholysis of the left 2nd and 4th toenails but cannot further evaluate the toenails due to nail polish in place.  There is some peeling and dry skin to the plantar aspect of the left forefoot.  Minimal diffuse erythema noted in these areas.  This is consistent with chronic tinea pedis.  The right foot is within normal limits and does not have this skin change.  Assessment/Plan of Care: 1. Nail fungus   2. Tinea pedis of both feet     Discussed findings with the patient.  Will plan to start her on oral terbinafine 250 mg 1 tablet p.o. daily.  Will first need to obtain a hepatic function test and review those results.  We did discuss the risks involved with the medication, as well as the need to abruptly stop the medication if she becomes pregnant, keep alcohol intake in moderation.  Will follow-up in approximately 3 months for recheck.   Awanda CHARM Imperial, DPM, FACFAS [facility]     [address] [place], [place] [address]                Office [phone]  Fax [phone]

## 2024-01-17 NOTE — Telephone Encounter (Signed)
 Pt has scheduled a lab appt at [facility] on 7/2, wants to have the podiatry labs done here. Dr. Sagardia would have to enter the order for this to be done at [facility], I believe this is what she is asking for.

## 2024-01-17 NOTE — Telephone Encounter (Signed)
 hepatic function panel order has been placed

## 2024-01-17 NOTE — Telephone Encounter (Signed)
 Copied from CRM [id number]. Topic: Clinical - Request for Lab/Test Order >> Jan 17, 2024 10:55 AM Sherry Kim wrote: Reason for CRM: Patient called in stating the labs she needs is hepatic function panel

## 2024-01-18 ENCOUNTER — Other Ambulatory Visit

## 2024-01-19 ENCOUNTER — Other Ambulatory Visit (INDEPENDENT_AMBULATORY_CARE_PROVIDER_SITE_OTHER)

## 2024-01-19 DIAGNOSIS — I8393 Asymptomatic varicose veins of bilateral lower extremities: Secondary | ICD-10-CM

## 2024-01-19 LAB — HEPATIC FUNCTION PANEL
ALT: 24 U/L (ref 0–35)
AST: 23 U/L (ref 0–37)
Albumin: 4.4 g/dL (ref 3.5–5.2)
Alkaline Phosphatase: 52 U/L (ref 39–117)
Bilirubin, Direct: 0.2 mg/dL (ref 0.0–0.3)
Total Bilirubin: 0.8 mg/dL (ref 0.2–1.2)
Total Protein: 7.3 g/dL (ref 6.0–8.3)

## 2024-01-24 ENCOUNTER — Telehealth: Payer: Self-pay | Admitting: Podiatry

## 2024-01-24 NOTE — Telephone Encounter (Signed)
 Results would need to be reviewed by provider before medication can be called in forwarded message to provider for review.

## 2024-01-24 NOTE — Telephone Encounter (Signed)
 Patient is following up to inquire if the medication suggested during the 7/1 visit has been sent to the pharmacy on file. Lab work completed on  7/3.

## 2024-01-24 NOTE — Telephone Encounter (Signed)
Thank you Irma!

## 2024-01-25 MED ORDER — TERBINAFINE HCL 250 MG PO TABS
250.0000 mg | ORAL_TABLET | Freq: Every day | ORAL | 0 refills | Status: AC
Start: 2024-01-25 — End: ?

## 2024-02-23 ENCOUNTER — Ambulatory Visit

## 2024-03-06 ENCOUNTER — Encounter: Payer: Self-pay | Admitting: Dermatology

## 2024-03-06 ENCOUNTER — Ambulatory Visit (INDEPENDENT_AMBULATORY_CARE_PROVIDER_SITE_OTHER): Payer: Medicaid Other | Admitting: Dermatology

## 2024-03-06 VITALS — BP 116/77

## 2024-03-06 DIAGNOSIS — Z1283 Encounter for screening for malignant neoplasm of skin: Secondary | ICD-10-CM | POA: Diagnosis not present

## 2024-03-06 DIAGNOSIS — D1801 Hemangioma of skin and subcutaneous tissue: Secondary | ICD-10-CM

## 2024-03-06 DIAGNOSIS — D229 Melanocytic nevi, unspecified: Secondary | ICD-10-CM

## 2024-03-06 DIAGNOSIS — L814 Other melanin hyperpigmentation: Secondary | ICD-10-CM | POA: Diagnosis not present

## 2024-03-06 DIAGNOSIS — L910 Hypertrophic scar: Secondary | ICD-10-CM | POA: Diagnosis not present

## 2024-03-06 DIAGNOSIS — L578 Other skin changes due to chronic exposure to nonionizing radiation: Secondary | ICD-10-CM

## 2024-03-06 DIAGNOSIS — W908XXA Exposure to other nonionizing radiation, initial encounter: Secondary | ICD-10-CM

## 2024-03-06 DIAGNOSIS — L821 Other seborrheic keratosis: Secondary | ICD-10-CM

## 2024-03-06 NOTE — Progress Notes (Signed)
   Total Body Skin Exam (TBSE) Visit   Subjective  Sherry Kim is a 23 y.o. female ESTABLISHED PATIENT who presents for the following:  Total Body Skin Exam (TBSE)  Patient was last evaluated for TBSE on 03/06/24 .  Patient does have spots of concern to be evaluated. She does apply sunscreen and/or wears protective coverings. Reports Hx of Bx - benign. Denied  family Hx of skin cancers.   The patient has spots, moles and lesions to be evaluated, some may be new or changing and the patient has concerns that these could be cancer.  The following portions of the chart were reviewed this encounter and updated as appropriate: medications, allergies, medical history  Review of Systems:  No other skin or systemic complaints except as noted in HPI or Assessment and Plan.  Objective  Well appearing patient in no apparent distress; mood and affect are within normal limits.  A full examination was performed including scalp, head, eyes, ears, nose, lips, neck, chest, axillae, abdomen, back, buttocks, bilateral upper extremities, bilateral lower extremities, hands, feet, fingers, toes, fingernails, and toenails. All findings within normal limits unless otherwise noted below.   Relevant physical exam findings are noted in the Assessment and Plan.    Assessment & Plan   LENTIGINES, HEMANGIOMAS - Benign normal skin lesions - Benign-appearing - Call for any changes  BENIGN MELANOCYTIC NEVI - Tan-brown and/or pink-flesh-colored symmetric macules and papules - Benign appearing on exam today - Observation - Call clinic for new or changing moles - Recommend daily use of broad spectrum spf 30+ sunscreen to sun-exposed areas.   MILD ACTINIC DAMAGE - Chronic condition, secondary to cumulative UV/sun exposure - diffuse scaly erythematous macules with underlying dyspigmentation - Recommend daily broad spectrum sunscreen SPF 30+ to sun-exposed areas, reapply every 2 hours as needed.  -  Staying in the shade or wearing long sleeves, sun glasses (UVA+UVB protection) and wide brim hats (4-inch brim around the entire circumference of the hat) are also recommended for sun protection.  - Call for new or changing lesions.   SKIN CANCER SCREENING PERFORMED TODAY.   KELOID Right Shoulder - Anterior Intralesional injection - Right Shoulder - Anterior Procedure Note Intralesional Injection  Location: R shoulder anterior  Informed Consent: Discussed risks (infection, pain, bleeding, bruising, thinning of the skin, loss of skin pigment, lack of resolution, and recurrence of lesion) and benefits of the procedure, as well as the alternatives. Informed consent was obtained. Preparation: The area was prepared a standard fashion.  Anesthesia:n/a  Procedure Details: An intralesional injection was performed with Kenalog 3.3 mg/cc. 0.01 cc in total were injected.  NDC #: 9996-9505-79 Exp: 09/2024  Total number of injections: 1  Plan: The patient was instructed on post-op care. Recommend OTC analgesia as needed for pain.   Return in about 1 year (around 03/06/2025) for TBSE.   Documentation: I have reviewed the above documentation for accuracy and completeness, and I agree with the above.  I, Shirron Maranda, CMA, am acting as scribe for [employer], DO.   Delon Lenis, DO

## 2024-03-06 NOTE — Patient Instructions (Addendum)
 Date: Tue Mar 06 2024  Hello Sherry Kim,  Thank you for visiting today. Here is a summary of the key instructions:  - Medications:   - Continue terbinafine  as prescribed by podiatrist   - Avoid drinking alcohol while taking this medication  - Skin Care:   - Continue using sunscreen daily and monthly monitoring of your moles  - Treatment Areas:   - Kenalog injection given for itchy bump on right anterior shoulder   - Expect itch to stop within 24 hours   - Bump should flatten within a month  - Follow-up:   - Contact our office if any concerns arise  Please reach out if you have any questions or concerns.  Warm regards,  Dr. Delon Lenis [facility]  Important Information  Due to recent changes in healthcare laws, you may see results of your pathology and/or laboratory studies on MyChart before the doctors have had a chance to review them. We understand that in some cases there may be results that are confusing or concerning to you. Please understand that not all results are received at the same time and often the doctors may need to interpret multiple results in order to provide you with the best plan of care or course of treatment. Therefore, we ask that you please give us  2 business days to thoroughly review all your results before contacting the office for clarification. Should we see a critical lab result, you will be contacted sooner.   If You Need Anything After Your Visit  If you have any questions or concerns for your doctor, please call our main line at [phone] If no one answers, please leave a voicemail as directed and we will return your call as soon as possible. Messages left after 4 pm will be answered the following business day.   You may also send us  a message via MyChart. We typically respond to MyChart messages within 1-2 business days.  For prescription refills, please ask your pharmacy to contact our office. Our fax number is [phone].  If you have  an urgent issue when the clinic is closed that cannot wait until the next business day, you can page your doctor at the number below.    Please note that while we do our best to be available for urgent issues outside of office hours, we are not available 24/7.   If you have an urgent issue and are unable to reach us , you may choose to seek medical care at your doctor's office, retail clinic, urgent care center, or emergency room.  If you have a medical emergency, please immediately call 911 or go to the emergency department. In the event of inclement weather, please call our main line at [phone] for an update on the status of any delays or closures.  Dermatology Medication Tips: Please keep the boxes that topical medications come in in order to help keep track of the instructions about where and how to use these. Pharmacies typically print the medication instructions only on the boxes and not directly on the medication tubes.   If your medication is too expensive, please contact our office at [phone] or send us  a message through MyChart.   We are unable to tell what your co-pay for medications will be in advance as this is different depending on your insurance coverage. However, we may be able to find a substitute medication at lower cost or fill out paperwork to get insurance to cover a needed medication.   If a prior  authorization is required to get your medication covered by your insurance company, please allow us  1-2 business days to complete this process.  Drug prices often vary depending on where the prescription is filled and some pharmacies may offer cheaper prices.  The website www.goodrx.com contains coupons for medications through different pharmacies. The prices here do not account for what the cost may be with help from insurance (it may be cheaper with your insurance), but the website can give you the price if you did not use any insurance.  - You can print the associated  coupon and take it with your prescription to the pharmacy.  - You may also stop by our office during regular business hours and pick up a GoodRx coupon card.  - If you need your prescription sent electronically to a different pharmacy, notify our office through [facility] or by phone at [phone]

## 2024-04-23 ENCOUNTER — Ambulatory Visit: Admitting: Podiatry

## 2024-05-08 ENCOUNTER — Ambulatory Visit: Admitting: Podiatry

## 2024-05-08 ENCOUNTER — Encounter: Payer: Self-pay | Admitting: Podiatry

## 2024-05-08 ENCOUNTER — Ambulatory Visit (INDEPENDENT_AMBULATORY_CARE_PROVIDER_SITE_OTHER): Admitting: Podiatry

## 2024-05-08 DIAGNOSIS — B351 Tinea unguium: Secondary | ICD-10-CM | POA: Diagnosis not present

## 2024-05-08 NOTE — Progress Notes (Signed)
  Chief Complaint  Patient presents with   Follow up    3 month F/U Lamisil . 0 pain    HPI: 23 y.o. female presents today for follow-up of onychomycosis to the left second and left fourth toenails.  She has finished the oral terbinafine  medication.  She did not have any side effects or adverse reactions to the oral medication.  She does feel it has been significant improvement to the left second toenail and feels that the left fourth toenail has gotten slightly brighter.  History reviewed. No pertinent past medical history. History reviewed. No pertinent surgical history. No Known Allergies   Physical Exam: Palpable pedal pulses.  No edema is appreciated.  No evidence of tinea pedis is noted today.  The left second toenail has approximately 50% clearing noted proximally.  The left fourth toenail has some superficial white onychomycosis but this is easily debrided away with the bur.  Assessment/Plan of Care: 1. Nail fungus     The left 2nd and 4th toenails were debrided with a power bur today.  She will continue to wait and see what observed the improvement of the nail.  She was informed that the medication will remain in the soft tissues for up to a year and continue working.  She will call within the next 3 to 4 months if she is not having significant improvement or if the clearing of the nails has stalled, and we can send in a 30-day pulsed dose of the oral terbinafine  if needed.  Otherwise follow-up as needed   Awanda CHARM Imperial, DPM, FACFAS [facility]     [address] [place], [place] [address]                Office [phone]  Fax [phone]

## 2024-05-24 ENCOUNTER — Ambulatory Visit: Admitting: Emergency Medicine

## 2024-05-24 ENCOUNTER — Encounter: Payer: Self-pay | Admitting: Emergency Medicine

## 2024-05-24 VITALS — BP 112/80 | HR 90 | Temp 97.7°F | Ht 61.0 in | Wt 168.0 lb

## 2024-05-24 DIAGNOSIS — R4582 Worries: Secondary | ICD-10-CM

## 2024-05-24 DIAGNOSIS — Z113 Encounter for screening for infections with a predominantly sexual mode of transmission: Secondary | ICD-10-CM | POA: Diagnosis not present

## 2024-05-24 LAB — COMPREHENSIVE METABOLIC PANEL WITH GFR
ALT: 16 U/L (ref 0–35)
AST: 17 U/L (ref 0–37)
Albumin: 4.2 g/dL (ref 3.5–5.2)
Alkaline Phosphatase: 59 U/L (ref 39–117)
BUN: 9 mg/dL (ref 6–23)
CO2: 29 meq/L (ref 19–32)
Calcium: 9.1 mg/dL (ref 8.4–10.5)
Chloride: 103 meq/L (ref 96–112)
Creatinine, Ser: 0.7 mg/dL (ref 0.40–1.20)
GFR: 122.68 mL/min (ref >60.00)
Glucose, Bld: 82 mg/dL (ref 70–99)
Potassium: 4 meq/L (ref 3.5–5.1)
Sodium: 140 meq/L (ref 135–145)
Total Bilirubin: 0.4 mg/dL (ref 0.2–1.2)
Total Protein: 7.3 g/dL (ref 6.0–8.3)

## 2024-05-24 LAB — CBC WITH DIFFERENTIAL/PLATELET
Basophils Absolute: 0 K/uL (ref 0.0–0.1)
Basophils Relative: 0.2 % (ref 0.0–3.0)
Eosinophils Absolute: 0.2 K/uL (ref 0.0–0.7)
Eosinophils Relative: 2.5 % (ref 0.0–5.0)
HCT: 40.3 % (ref 36.0–46.0)
Hemoglobin: 13.6 g/dL (ref 12.0–15.0)
Lymphocytes Relative: 26.3 % (ref 12.0–46.0)
Lymphs Abs: 2.4 K/uL (ref 0.7–4.0)
MCHC: 33.7 g/dL (ref 30.0–36.0)
MCV: 85.4 fl (ref 78.0–100.0)
Monocytes Absolute: 0.7 K/uL (ref 0.1–1.0)
Monocytes Relative: 7.4 % (ref 3.0–12.0)
Neutro Abs: 5.8 K/uL (ref 1.4–7.7)
Neutrophils Relative %: 63.6 % (ref 43.0–77.0)
Platelets: 290 K/uL (ref 150.0–400.0)
RBC: 4.72 Mil/uL (ref 3.87–5.11)
RDW: 13 % (ref 11.5–15.5)
WBC: 9.1 K/uL (ref 4.0–10.5)

## 2024-05-24 NOTE — Progress Notes (Signed)
 Sherry Kim 23 y.o.   Chief Complaint  Patient presents with   Exposure to STD    Pt states that she wants to just get a regular standard testing done    HISTORY OF PRESENT ILLNESS: This is a 23 y.o. female concerned about exposure to STD Requesting testing Asymptomatic No other complaints or medical concerns today.  Exposure to STD  The patient's pertinent negatives include no dysuria. Pertinent negatives include no abdominal pain, fever or sore throat.     Prior to Admission medications   Medication Sig Start Date End Date Taking? Authorizing Provider  Multiple Vitamin (MULTIVITAMIN) tablet Take 1 tablet by mouth daily.    [provider]  terbinafine  (LAMISIL ) 250 MG tablet Take 1 tablet (250 mg total) by mouth daily. 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  No past surgical history on file.  Social History   Socioeconomic History   Marital status: Single    Spouse name: Not on file   Number of children: Not on file   Years of education: Not on file   Highest education level: Some college, no degree  Occupational History   Not on file  Tobacco Use   Smoking status: Never    Passive exposure: Never   Smokeless tobacco: Never  Vaping Use   Vaping status: Never Used  Substance and Sexual Activity   Alcohol use: Never   Drug use: Never   Sexual activity: Not on file  Other Topics Concern   Not on file  Social History Narrative   Not on file   Social Drivers of Health   Financial Resource  Strain: Medium Risk (11/24/2023)   Overall Financial Resource Strain (CARDIA)    Difficulty of Paying Living Expenses: Somewhat hard  Food Insecurity: No Food Insecurity (11/24/2023)   Hunger Vital Sign    Worried About Running Out of Food in the Last Year: Never true    Ran Out of Food in the Last Year: Never true  Transportation Needs: No Transportation Needs (11/24/2023)   PRAPARE - Administrator, Civil Service (Medical): No    Lack of Transportation (Non-Medical): No  Physical Activity: Sufficiently Active (11/24/2023)   Exercise Vital Sign    Days of Exercise per Week: 5 days    Minutes of Exercise per Session: 30 min  Stress: Stress Concern Present (11/24/2023)   [employer] of Occupational Health - Occupational Stress Questionnaire    Feeling of Stress : Rather much  Social Connections: Socially Isolated (11/24/2023)   Social Connection and Isolation Panel    Frequency of Communication with Friends and Family: More than three times a week    Frequency of Social Gatherings with Friends and Family: Once a week    Attends Religious Services: Never    Database Administrator or Organizations: No    Attends Engineer, Structural: Not on file    Marital Status: Never married  Intimate Partner Violence: Not on file    Family History  Problem Relation Age of Onset  Migraines Mother    Ankylosing spondylitis Father    Squamous cell carcinoma Father      Review of Systems  Constitutional: Negative.  Negative for chills and fever.  HENT:  Positive for congestion. Negative for sore throat.   Respiratory: Negative.  Negative for cough and shortness of breath.   Cardiovascular: Negative.  Negative for chest pain and palpitations.  Gastrointestinal:  Negative for abdominal pain, diarrhea, nausea and vomiting.  Genitourinary: Negative.  Negative for dysuria and hematuria.  Skin: Negative.  Negative for rash.  Neurological: Negative.  Negative for dizziness and  headaches.  All other systems reviewed and are negative.   Vitals:   05/24/24 1401  BP: 112/80  Pulse: 90  Temp: 97.7 F (36.5 C)  SpO2: 99%    Physical Exam Vitals reviewed.  Constitutional:      Appearance: Normal appearance.  HENT:     Head: Normocephalic.  Eyes:     Extraocular Movements: Extraocular movements intact.  Cardiovascular:     Rate and Rhythm: Normal rate.  Pulmonary:     Effort: Pulmonary effort is normal.  Skin:    General: Skin is warm and dry.  Neurological:     Mental Status: She is alert and oriented to person, place, and time.  Psychiatric:        Mood and Affect: Mood normal.        Behavior: Behavior normal.      ASSESSMENT & PLAN: Problem List Items Addressed This Visit   None Visit Diagnoses       Worries    -  Primary     Routine screening for STI (sexually transmitted infection)       Relevant Orders   GC/Chlamydia Probe Amp   Comprehensive metabolic panel with GFR   CBC with Differential/Platelet   RPR   Hepatitis C antibody   HIV Antibody (routine testing w rflx)      Patient Instructions  Preventing Sexually Transmitted Infections, Teen Sexually transmitted infections (STIs) are spread from person to person (are contagious). They are spread, or transmitted, during sex. The sex may be vaginal, anal, or oral. STIs can be passed during sexual contact with skin, genitals, mouth, or rectum. They may spread through body fluids, such as saliva, semen, blood, vaginal mucus, and urine. STIs are very common. They can happen in people of all ages. Some common STIs are: Herpes. Hepatitis B. Chlamydia. Gonorrhea. Syphilis. Trichomoniasis. Human papillomavirus (HPV). Human immunodeficiency virus (HIV). This can cause acquired immunodeficiency syndrome (AIDS). How can STIs affect me? You may not have symptoms with an STI. Even if you do not have symptoms, you can still spread the infection to others. You also still need  treatment. STIs can be treated. Some STIs can be cured. Other STIs cannot be cured and will affect you for the rest of your life. Certain STIs may: Require you to take medicine for the rest of your life. Affect your ability to have children. Increase your risk for getting other STIs. Increase your risk of getting certain conditions. These may include: Cervical cancer. Pelvic inflammatory disease (PID). Organ damage or damage to other parts of your body. This can happen if the infection spreads. Cause problems during pregnancy. STIs may be spread to the baby during pregnancy or birth. Females tend to have more severe problems from STIs than males. What can increase my risk? You may be more at risk for an STI if: You do not use protection during sex. You have  more than one sex partner. You have a sex partner who has other sex partners. You have sex with a person who has an STI. You have an STI, or you have had an STI before. You inject drugs or have a sex partner who injects drugs. What actions can I take to prevent STIs? The only way to fully prevent STIs is not to have sex of any kind. This is called practicing abstinence. If you are sexually active, you can protect yourself and others by taking these actions to lower your risk of getting an STI: Lifestyle Have only one sex partner or limit the number of sex partners you have. Do not use alcohol or drugs. Alcohol and drug use can affect your ability to make good choices. This can lead to risky sexual behaviors. Go to prevention counseling. This can teach you how to avoid getting an STI. Barrier protection Use methods to stop body fluids from being exchanged between partners during sex (barrier protection). These methods can be used during oral, vaginal, or anal sex. They include: External condom, for males. Internal condom, for females. Dental dam. Use a new barrier method for every sex act from start to finish. Know that a barrier  method may not protect you from all STIs. Some STIs, such as herpes, are spread through skin-to-skin contact. Avoid all sexual contact if you or a partner has herpes and there is an active flare with open sores. Birth control pills, injections, implants, and intrauterine devices (IUDs) do not protect against STIs. To prevent both STIs and pregnancy, always use a condom with a second form of birth control. General information  Ask your health care provider about taking pre-exposure prophylaxis (PrEP) to prevent HIV. Stay up to date on your vaccines. Some vaccines can lower your risk of getting certain STIs. These include: Hepatitis B vaccine. HPV vaccine. This is recommended for people starting at the age of 83 or 66. Get tested for STIs. Have your partners get tested, too. If you test positive for an STI, follow recommendations from your health care provider about treatment. Make sure your sex partners are tested and treated as well. Where to find more information Learn more about STIs from: Centers for Disease Control and Prevention (CDC) More information about certain STIs: tonerpromos.no Places to get sexual health counseling and treatment for free or at a low cost: gettested.tonerpromos.no U.S. Department of Health and Human Services [facility]): travellesson.ca This information is not intended to replace advice given to you by your health care provider. Make sure you discuss any questions you have with your health care provider. Document Revised: 07/15/2022 Document Reviewed: 12/18/2021 Elsevier Patient Education  2024 Elsevier Inc.     Emil Schaumann, MD [facility]

## 2024-05-24 NOTE — Patient Instructions (Signed)
Preventing Sexually Transmitted Infections, Teen Sexually transmitted infections (STIs) are spread from person to person (are contagious). They are spread, or transmitted, during sex. The sex may be vaginal, anal, or oral. STIs can be passed during sexual contact with skin, genitals, mouth, or rectum. They may spread through body fluids, such as saliva, semen, blood, vaginal mucus, and urine. STIs are very common. They can happen in people of all ages. Some common STIs are: Herpes. Hepatitis B. Chlamydia. Gonorrhea. Syphilis. Trichomoniasis. Human papillomavirus (HPV). Human immunodeficiency virus (HIV). This can cause acquired immunodeficiency syndrome (AIDS). How can STIs affect me? You may not have symptoms with an STI. Even if you do not have symptoms, you can still spread the infection to others. You also still need treatment. STIs can be treated. Some STIs can be cured. Other STIs cannot be cured and will affect you for the rest of your life. Certain STIs may: Require you to take medicine for the rest of your life. Affect your ability to have children. Increase your risk for getting other STIs. Increase your risk of getting certain conditions. These may include: Cervical cancer. Pelvic inflammatory disease (PID). Organ damage or damage to other parts of your body. This can happen if the infection spreads. Cause problems during pregnancy. STIs may be spread to the baby during pregnancy or birth. Females tend to have more severe problems from STIs than males. What can increase my risk? You may be more at risk for an STI if: You do not use protection during sex. You have more than one sex partner. You have a sex partner who has other sex partners. You have sex with a person who has an STI. You have an STI, or you have had an STI before. You inject drugs or have a sex partner who injects drugs. What actions can I take to prevent STIs? The only way to fully prevent STIs is not to  have sex of any kind. This is called practicing abstinence. If you are sexually active, you can protect yourself and others by taking these actions to lower your risk of getting an STI: Lifestyle Have only one sex partner or limit the number of sex partners you have. Do not use alcohol or drugs. Alcohol and drug use can affect your ability to make good choices. This can lead to risky sexual behaviors. Go to prevention counseling. This can teach you how to avoid getting an STI. Barrier protection Use methods to stop body fluids from being exchanged between partners during sex (barrier protection). These methods can be used during oral, vaginal, or anal sex. They include: External condom, for males. Internal condom, for females. Dental dam. Use a new barrier method for every sex act from start to finish. Know that a barrier method may not protect you from all STIs. Some STIs, such as herpes, are spread through skin-to-skin contact. Avoid all sexual contact if you or a partner has herpes and there is an active flare with open sores. Birth control pills, injections, implants, and intrauterine devices (IUDs) do not protect against STIs. To prevent both STIs and pregnancy, always use a condom with a second form of birth control. General information  Ask your health care provider about taking pre-exposure prophylaxis (PrEP) to prevent HIV. Stay up to date on your vaccines. Some vaccines can lower your risk of getting certain STIs. These include: Hepatitis B vaccine. HPV vaccine. This is recommended for people starting at the age of 61 or 6. Get tested for STIs.  Have your partners get tested, too. If you test positive for an STI, follow recommendations from your health care provider about treatment. Make sure your sex partners are tested and treated as well. Where to find more information Learn more about STIs from: Centers for Disease Control and Prevention (CDC) More information about certain  STIs: TonerPromos.no Places to get sexual health counseling and treatment for free or at a low cost: gettested.TonerPromos.no U.S. Department of Health and Human Services [facility]): TravelLesson.ca This information is not intended to replace advice given to you by your health care provider. Make sure you discuss any questions you have with your health care provider. Document Revised: 07/15/2022 Document Reviewed: 12/18/2021 Elsevier Patient Education  2024 [employer].

## 2024-05-25 LAB — RPR: RPR Ser Ql: NONREACTIVE

## 2024-05-25 LAB — HEPATITIS C ANTIBODY: Hepatitis C Ab: NONREACTIVE

## 2024-05-25 LAB — HIV ANTIBODY (ROUTINE TESTING W REFLEX)
HIV 1&2 Ab, 4th Generation: NONREACTIVE
HIV FINAL INTERPRETATION: NEGATIVE

## 2024-05-28 LAB — GC/CHLAMYDIA PROBE AMP
Chlamydia trachomatis, NAA: NEGATIVE
Neisseria Gonorrhoeae by PCR: NEGATIVE

## 2024-07-26 ENCOUNTER — Encounter: Payer: Self-pay | Admitting: Emergency Medicine

## 2024-07-26 DIAGNOSIS — R7689 Other specified abnormal immunological findings in serum: Secondary | ICD-10-CM

## 2024-07-26 NOTE — Telephone Encounter (Signed)
 I see and old referral for rheumatologist in 2022 ok to resend? Or schedule and appointment

## 2024-07-26 NOTE — Telephone Encounter (Signed)
 Okay to send referral.

## 2024-08-21 NOTE — Assessment & Plan Note (Signed)
 Sherry Kim

## 2024-08-21 NOTE — Assessment & Plan Note (Signed)
 SABRA

## 2024-08-29 ENCOUNTER — Ambulatory Visit: Admitting: Internal Medicine

## 2024-08-29 DIAGNOSIS — R21 Rash and other nonspecific skin eruption: Secondary | ICD-10-CM

## 2024-08-29 DIAGNOSIS — M357 Hypermobility syndrome: Secondary | ICD-10-CM

## 2024-08-29 DIAGNOSIS — M533 Sacrococcygeal disorders, not elsewhere classified: Secondary | ICD-10-CM

## 2024-08-29 DIAGNOSIS — R7689 Other specified abnormal immunological findings in serum: Secondary | ICD-10-CM

## 2025-03-07 ENCOUNTER — Ambulatory Visit: Admitting: Dermatology
